# Patient Record
Sex: Female | Born: 1935 | Race: White | Hispanic: Yes | Smoking: Never smoker
Health system: Southern US, Community
[De-identification: ages and names within clinical notes are randomized; demographics above are authoritative.]

## PROBLEM LIST (undated history)

## (undated) DIAGNOSIS — R9431 Abnormal electrocardiogram [ECG] [EKG]: Secondary | ICD-10-CM

## (undated) DIAGNOSIS — F32A Depression, unspecified: Secondary | ICD-10-CM

## (undated) DIAGNOSIS — I4891 Unspecified atrial fibrillation: Secondary | ICD-10-CM

## (undated) DIAGNOSIS — I351 Nonrheumatic aortic (valve) insufficiency: Secondary | ICD-10-CM

## (undated) DIAGNOSIS — F329 Major depressive disorder, single episode, unspecified: Secondary | ICD-10-CM

## (undated) DIAGNOSIS — M1990 Unspecified osteoarthritis, unspecified site: Secondary | ICD-10-CM

## (undated) DIAGNOSIS — I340 Nonrheumatic mitral (valve) insufficiency: Secondary | ICD-10-CM

## (undated) DIAGNOSIS — F419 Anxiety disorder, unspecified: Secondary | ICD-10-CM

## (undated) DIAGNOSIS — I10 Essential (primary) hypertension: Secondary | ICD-10-CM

## (undated) DIAGNOSIS — G20 Parkinson's disease: Secondary | ICD-10-CM

## (undated) DIAGNOSIS — G20A1 Parkinson's disease without dyskinesia, without mention of fluctuations: Secondary | ICD-10-CM

## (undated) DIAGNOSIS — S32000A Wedge compression fracture of unspecified lumbar vertebra, initial encounter for closed fracture: Secondary | ICD-10-CM

## (undated) HISTORY — DX: Nonrheumatic aortic (valve) insufficiency: I35.1

## (undated) HISTORY — DX: Nonrheumatic mitral (valve) insufficiency: I34.0

## (undated) HISTORY — DX: Hypomagnesemia: E83.42

## (undated) HISTORY — DX: Abnormal electrocardiogram (ECG) (EKG): R94.31

## (undated) HISTORY — DX: Essential (primary) hypertension: I10

## (undated) HISTORY — DX: Wedge compression fracture of unspecified lumbar vertebra, initial encounter for closed fracture: S32.000A

---

## 2013-10-07 HISTORY — PX: PACEMAKER INSERTION: SHX728

## 2018-01-12 ENCOUNTER — Other Ambulatory Visit (HOSPITAL_COMMUNITY): Payer: Self-pay | Admitting: Family Medicine

## 2018-01-12 DIAGNOSIS — M549 Dorsalgia, unspecified: Secondary | ICD-10-CM

## 2018-01-18 ENCOUNTER — Other Ambulatory Visit: Payer: Self-pay | Admitting: Family Medicine

## 2018-01-18 DIAGNOSIS — R141 Gas pain: Secondary | ICD-10-CM

## 2018-01-30 ENCOUNTER — Other Ambulatory Visit: Payer: Self-pay

## 2018-01-31 ENCOUNTER — Other Ambulatory Visit: Payer: Self-pay

## 2018-01-31 ENCOUNTER — Emergency Department (HOSPITAL_COMMUNITY): Payer: Medicare HMO

## 2018-01-31 ENCOUNTER — Inpatient Hospital Stay (HOSPITAL_COMMUNITY)
Admission: EM | Admit: 2018-01-31 | Discharge: 2018-02-07 | DRG: 543 | Disposition: A | Discharge status: Skilled Nursing Facility | Payer: Medicare HMO | Attending: Family Medicine | Admitting: Family Medicine

## 2018-01-31 ENCOUNTER — Encounter (HOSPITAL_COMMUNITY): Payer: Self-pay | Admitting: Emergency Medicine

## 2018-01-31 DIAGNOSIS — S32040A Wedge compression fracture of fourth lumbar vertebra, initial encounter for closed fracture: Secondary | ICD-10-CM

## 2018-01-31 DIAGNOSIS — Z950 Presence of cardiac pacemaker: Secondary | ICD-10-CM

## 2018-01-31 DIAGNOSIS — E876 Hypokalemia: Secondary | ICD-10-CM | POA: Diagnosis not present

## 2018-01-31 DIAGNOSIS — R52 Pain, unspecified: Secondary | ICD-10-CM

## 2018-01-31 DIAGNOSIS — S2243XA Multiple fractures of ribs, bilateral, initial encounter for closed fracture: Secondary | ICD-10-CM | POA: Diagnosis present

## 2018-01-31 DIAGNOSIS — Z7401 Bed confinement status: Secondary | ICD-10-CM | POA: Diagnosis not present

## 2018-01-31 DIAGNOSIS — M4854XA Collapsed vertebra, not elsewhere classified, thoracic region, initial encounter for fracture: Secondary | ICD-10-CM | POA: Diagnosis present

## 2018-01-31 DIAGNOSIS — S32030A Wedge compression fracture of third lumbar vertebra, initial encounter for closed fracture: Secondary | ICD-10-CM

## 2018-01-31 DIAGNOSIS — S32010A Wedge compression fracture of first lumbar vertebra, initial encounter for closed fracture: Secondary | ICD-10-CM

## 2018-01-31 DIAGNOSIS — S32000D Wedge compression fracture of unspecified lumbar vertebra, subsequent encounter for fracture with routine healing: Secondary | ICD-10-CM | POA: Diagnosis not present

## 2018-01-31 DIAGNOSIS — F419 Anxiety disorder, unspecified: Secondary | ICD-10-CM | POA: Diagnosis present

## 2018-01-31 DIAGNOSIS — Z9181 History of falling: Secondary | ICD-10-CM

## 2018-01-31 DIAGNOSIS — S22000D Wedge compression fracture of unspecified thoracic vertebra, subsequent encounter for fracture with routine healing: Secondary | ICD-10-CM | POA: Diagnosis not present

## 2018-01-31 DIAGNOSIS — M6281 Muscle weakness (generalized): Secondary | ICD-10-CM | POA: Diagnosis not present

## 2018-01-31 DIAGNOSIS — F329 Major depressive disorder, single episode, unspecified: Secondary | ICD-10-CM | POA: Diagnosis present

## 2018-01-31 DIAGNOSIS — I4891 Unspecified atrial fibrillation: Secondary | ICD-10-CM | POA: Diagnosis not present

## 2018-01-31 DIAGNOSIS — Z888 Allergy status to other drugs, medicaments and biological substances status: Secondary | ICD-10-CM

## 2018-01-31 DIAGNOSIS — R7303 Prediabetes: Secondary | ICD-10-CM | POA: Diagnosis present

## 2018-01-31 DIAGNOSIS — F32A Depression, unspecified: Secondary | ICD-10-CM

## 2018-01-31 DIAGNOSIS — S32000A Wedge compression fracture of unspecified lumbar vertebra, initial encounter for closed fracture: Secondary | ICD-10-CM

## 2018-01-31 DIAGNOSIS — K219 Gastro-esophageal reflux disease without esophagitis: Secondary | ICD-10-CM | POA: Diagnosis not present

## 2018-01-31 DIAGNOSIS — H409 Unspecified glaucoma: Secondary | ICD-10-CM | POA: Diagnosis not present

## 2018-01-31 DIAGNOSIS — M549 Dorsalgia, unspecified: Secondary | ICD-10-CM | POA: Diagnosis not present

## 2018-01-31 DIAGNOSIS — S32020A Wedge compression fracture of second lumbar vertebra, initial encounter for closed fracture: Secondary | ICD-10-CM

## 2018-01-31 DIAGNOSIS — R41841 Cognitive communication deficit: Secondary | ICD-10-CM | POA: Diagnosis not present

## 2018-01-31 DIAGNOSIS — S22000A Wedge compression fracture of unspecified thoracic vertebra, initial encounter for closed fracture: Secondary | ICD-10-CM

## 2018-01-31 DIAGNOSIS — I10 Essential (primary) hypertension: Secondary | ICD-10-CM | POA: Diagnosis present

## 2018-01-31 DIAGNOSIS — K5900 Constipation, unspecified: Secondary | ICD-10-CM | POA: Diagnosis not present

## 2018-01-31 DIAGNOSIS — Z7901 Long term (current) use of anticoagulants: Secondary | ICD-10-CM | POA: Diagnosis not present

## 2018-01-31 DIAGNOSIS — R262 Difficulty in walking, not elsewhere classified: Secondary | ICD-10-CM | POA: Diagnosis not present

## 2018-01-31 DIAGNOSIS — Z23 Encounter for immunization: Secondary | ICD-10-CM | POA: Diagnosis not present

## 2018-01-31 DIAGNOSIS — Z79899 Other long term (current) drug therapy: Secondary | ICD-10-CM

## 2018-01-31 DIAGNOSIS — M4856XA Collapsed vertebra, not elsewhere classified, lumbar region, initial encounter for fracture: Secondary | ICD-10-CM | POA: Diagnosis not present

## 2018-01-31 DIAGNOSIS — D649 Anemia, unspecified: Secondary | ICD-10-CM | POA: Diagnosis not present

## 2018-01-31 DIAGNOSIS — G20 Parkinson's disease: Secondary | ICD-10-CM | POA: Diagnosis not present

## 2018-01-31 DIAGNOSIS — M2550 Pain in unspecified joint: Secondary | ICD-10-CM | POA: Diagnosis not present

## 2018-01-31 HISTORY — DX: Major depressive disorder, single episode, unspecified: F32.9

## 2018-01-31 HISTORY — DX: Parkinson's disease without dyskinesia, without mention of fluctuations: G20.A1

## 2018-01-31 HISTORY — DX: Essential (primary) hypertension: I10

## 2018-01-31 HISTORY — DX: Depression, unspecified: F32.A

## 2018-01-31 HISTORY — DX: Wedge compression fracture of unspecified lumbar vertebra, initial encounter for closed fracture: S32.000A

## 2018-01-31 HISTORY — DX: Anxiety disorder, unspecified: F41.9

## 2018-01-31 HISTORY — DX: Parkinson's disease: G20

## 2018-01-31 HISTORY — DX: Unspecified atrial fibrillation: I48.91

## 2018-01-31 HISTORY — DX: Unspecified osteoarthritis, unspecified site: M19.90

## 2018-01-31 LAB — BASIC METABOLIC PANEL
ANION GAP: 11 (ref 5–15)
BUN: 17 mg/dL (ref 8–23)
CALCIUM: 9.7 mg/dL (ref 8.9–10.3)
CO2: 28 mmol/L (ref 22–32)
Chloride: 103 mmol/L (ref 98–111)
Creatinine, Ser: 0.8 mg/dL (ref 0.44–1.00)
GFR calc Af Amer: >60 mL/min (ref >60)
GLUCOSE: 93 mg/dL (ref 70–99)
Potassium: 3.8 mmol/L (ref 3.5–5.1)
SODIUM: 142 mmol/L (ref 135–145)

## 2018-01-31 LAB — HEPATIC FUNCTION PANEL
ALBUMIN: 3.3 g/dL — AB (ref 3.5–5.0)
ALT: 16 U/L (ref 0–44)
AST: 27 U/L (ref 15–41)
Alkaline Phosphatase: 159 U/L — ABNORMAL HIGH (ref 38–126)
BILIRUBIN TOTAL: 0.9 mg/dL (ref 0.3–1.2)
Bilirubin, Direct: 0.2 mg/dL (ref 0.0–0.2)
Indirect Bilirubin: 0.7 mg/dL (ref 0.3–0.9)
Total Protein: 6.8 g/dL (ref 6.5–8.1)

## 2018-01-31 LAB — URINALYSIS, ROUTINE W REFLEX MICROSCOPIC
BACTERIA UA: NONE SEEN
Bilirubin Urine: NEGATIVE
Glucose, UA: NEGATIVE mg/dL
KETONES UR: NEGATIVE mg/dL
Leukocytes, UA: NEGATIVE
Nitrite: NEGATIVE
PH: 8 (ref 5.0–8.0)
Protein, ur: NEGATIVE mg/dL
SPECIFIC GRAVITY, URINE: 1.006 (ref 1.005–1.030)

## 2018-01-31 LAB — CBC WITH DIFFERENTIAL/PLATELET
BASOS ABS: 0 10*3/uL (ref 0.0–0.1)
Basophils Relative: 0 %
EOS PCT: 0 %
Eosinophils Absolute: 0 10*3/uL (ref 0.0–0.7)
HCT: 38.8 % (ref 36.0–46.0)
Hemoglobin: 12.4 g/dL (ref 12.0–15.0)
Lymphocytes Relative: 6 %
Lymphs Abs: 0.6 10*3/uL — ABNORMAL LOW (ref 0.7–4.0)
MCH: 31.8 pg (ref 26.0–34.0)
MCHC: 32 g/dL (ref 30.0–36.0)
MCV: 99.5 fL (ref 78.0–100.0)
MONO ABS: 0.6 10*3/uL (ref 0.1–1.0)
Monocytes Relative: 7 %
Neutro Abs: 8 10*3/uL — ABNORMAL HIGH (ref 1.7–7.7)
Neutrophils Relative %: 87 %
PLATELETS: 322 10*3/uL (ref 150–400)
RBC: 3.9 MIL/uL (ref 3.87–5.11)
RDW: 14 % (ref 11.5–15.5)
WBC: 9.3 10*3/uL (ref 4.0–10.5)

## 2018-01-31 LAB — PROTIME-INR
INR: 1.19
Prothrombin Time: 15 seconds (ref 11.4–15.2)

## 2018-01-31 LAB — MAGNESIUM: Magnesium: 1.7 mg/dL (ref 1.7–2.4)

## 2018-01-31 LAB — PHOSPHORUS: PHOSPHORUS: 3.3 mg/dL (ref 2.5–4.6)

## 2018-01-31 LAB — APTT: APTT: 34 s (ref 24–36)

## 2018-01-31 MED ORDER — MORPHINE SULFATE (PF) 4 MG/ML IV SOLN
4.0000 mg | Freq: Once | INTRAVENOUS | Status: AC
  Administered 2018-01-31: 4 mg via INTRAVENOUS
  Filled 2018-01-31: qty 1

## 2018-01-31 MED ORDER — ACETAMINOPHEN 500 MG PO TABS
500.0000 mg | ORAL_TABLET | Freq: Four times a day (QID) | ORAL | Status: DC
  Administered 2018-01-31 – 2018-02-07 (×25): 500 mg via ORAL
  Filled 2018-01-31 (×24): qty 1

## 2018-01-31 MED ORDER — PANTOPRAZOLE SODIUM 40 MG PO TBEC
40.0000 mg | DELAYED_RELEASE_TABLET | Freq: Every day | ORAL | Status: DC
  Administered 2018-02-01 – 2018-02-07 (×7): 40 mg via ORAL
  Filled 2018-01-31 (×7): qty 1

## 2018-01-31 MED ORDER — ONDANSETRON HCL 4 MG/2ML IJ SOLN: 4.0000 mg | Freq: Four times a day (QID) | INTRAMUSCULAR | Status: DC | PRN

## 2018-01-31 MED ORDER — CITALOPRAM HYDROBROMIDE 20 MG PO TABS
20.0000 mg | ORAL_TABLET | Freq: Every day | ORAL | Status: DC
  Administered 2018-01-31 – 2018-02-06 (×7): 20 mg via ORAL
  Filled 2018-01-31 (×7): qty 1

## 2018-01-31 MED ORDER — ENOXAPARIN SODIUM 60 MG/0.6ML ~~LOC~~ SOLN
1.0000 mg/kg | Freq: Two times a day (BID) | SUBCUTANEOUS | Status: DC
  Administered 2018-01-31 – 2018-02-04 (×8): 50 mg via SUBCUTANEOUS
  Filled 2018-01-31 (×8): qty 0.5

## 2018-01-31 MED ORDER — DOCUSATE SODIUM 100 MG PO CAPS
100.0000 mg | ORAL_CAPSULE | Freq: Two times a day (BID) | ORAL | Status: DC
  Administered 2018-01-31 – 2018-02-04 (×8): 100 mg via ORAL
  Filled 2018-01-31 (×8): qty 1

## 2018-01-31 MED ORDER — OXYCODONE HCL 5 MG PO TABS
5.0000 mg | ORAL_TABLET | ORAL | Status: DC | PRN
  Administered 2018-01-31 – 2018-02-01 (×2): 5 mg via ORAL
  Filled 2018-01-31 (×2): qty 1

## 2018-01-31 MED ORDER — PREGABALIN 75 MG PO CAPS
75.0000 mg | ORAL_CAPSULE | Freq: Two times a day (BID) | ORAL | Status: DC
  Administered 2018-01-31 – 2018-02-07 (×14): 75 mg via ORAL
  Filled 2018-01-31 (×14): qty 1

## 2018-01-31 MED ORDER — HYDROMORPHONE HCL 1 MG/ML IJ SOLN
0.5000 mg | INTRAMUSCULAR | Status: DC | PRN
  Administered 2018-01-31 – 2018-02-05 (×16): 0.5 mg via INTRAVENOUS
  Filled 2018-01-31 (×14): qty 0.5
  Filled 2018-01-31: qty 1
  Filled 2018-01-31: qty 0.5

## 2018-01-31 MED ORDER — METOPROLOL SUCCINATE ER 25 MG PO TB24
25.0000 mg | ORAL_TABLET | Freq: Every day | ORAL | Status: DC
  Administered 2018-02-01 – 2018-02-07 (×7): 25 mg via ORAL
  Filled 2018-01-31 (×7): qty 1

## 2018-01-31 MED ORDER — POLYETHYLENE GLYCOL 3350 17 G PO PACK
17.0000 g | PACK | Freq: Every day | ORAL | Status: DC | PRN
  Administered 2018-02-03: 17 g via ORAL

## 2018-01-31 MED ORDER — SENNA 8.6 MG PO TABS
1.0000 | ORAL_TABLET | Freq: Two times a day (BID) | ORAL | Status: DC
  Administered 2018-01-31 – 2018-02-04 (×8): 8.6 mg via ORAL
  Filled 2018-01-31 (×8): qty 1

## 2018-01-31 MED ORDER — PRAMIPEXOLE DIHYDROCHLORIDE 0.25 MG PO TABS
0.2500 mg | ORAL_TABLET | Freq: Three times a day (TID) | ORAL | Status: DC
  Administered 2018-01-31 – 2018-02-07 (×20): 0.25 mg via ORAL
  Filled 2018-01-31 (×20): qty 1

## 2018-01-31 MED ORDER — CALCIUM CARBONATE-VITAMIN D 500-200 MG-UNIT PO TABS
1.0000 | ORAL_TABLET | Freq: Every day | ORAL | Status: DC
  Administered 2018-02-02 – 2018-02-07 (×6): 1 via ORAL
  Filled 2018-01-31 (×6): qty 1

## 2018-01-31 MED ORDER — ONDANSETRON HCL 4 MG PO TABS: 4.0000 mg | ORAL_TABLET | Freq: Four times a day (QID) | ORAL | Status: DC | PRN

## 2018-01-31 MED ORDER — FENTANYL CITRATE (PF) 100 MCG/2ML IJ SOLN
12.5000 ug | Freq: Once | INTRAMUSCULAR | Status: AC
  Administered 2018-01-31: 12.5 ug via INTRAVENOUS
  Filled 2018-01-31: qty 2

## 2018-01-31 NOTE — ED Notes (Signed)
[employer] called for TLSO brace.

## 2018-01-31 NOTE — ED Provider Notes (Addendum)
Patient accepted at shift change from Dr. Jeraldine LootsLockwood for planned admission  Per nursing staff request for additional pain medication have provided 4 mg morphine.  Clinical Course as of Jan 31 1810  Wed Jan 31, 2018  1802 Page text sent to Dr. Alanda SlimGonfa to update on x-ray result and Dr. Priscille LovelessLockwood's conversation with referring orthopod for planned orthopedic consultation in a.m.   [MP]  1806 Dr. Alanda SlimGonfa called back to state that because the x-ray was not completed here, although patient was referred by Ortho with known fractures, he wanted the work-up complete before he could be consulted for admission.  Dr. Jeraldine LootsLockwood has consulted orthopedics who referred the patient to the emergency department and who plans to do consultation in the morning.  Dr. Alanda SlimGonfa advises at this time the patient should go back into the admission pool and a reconsult to the hospitalist group.  As per Dr. Sundra AlandGonfa's request, consultation has been placed back in to the hospitalist que.   [MP]    Clinical Course User Index [MP] Arby BarrettePfeiffer, Braydn Carneiro, MD     Arby BarrettePfeiffer, Susanna Benge, MD 01/31/18 1810    Arby BarrettePfeiffer, Christasia Angeletti, MD 01/31/18 [id number]

## 2018-01-31 NOTE — Discharge Planning (Signed)
Clinical Social Work is seeking post-discharge placement for this patient at the following level of care: SNF.    

## 2018-01-31 NOTE — ED Provider Notes (Addendum)
[facility]-EMERGENCY DEPT Provider Note   CSN: [id number] Arrival date & time: 01/31/18  4098     History   Chief Complaint Chief Complaint  Patient presents with  . Back Pain  . Abdominal Pain    HPI Teresa Bates is a 84 y.o. female.  HPI Vision presents with her son who assists with the HPI. She presents to persistent/worsening low back pain purulent pain is severe in the back, sore, interfering with her ability to ambulate Pain radiates and to the abdomen, but she complains essentially of low back pain, states that this is her primary concern. Patient has previously diagnosed compression fractures in the lumbar spine. She has had minimal relief using Norco at home. No new incontinence, no new fall She typically uses a walker, but even with this assistant she is unable to ambulate at home. No new incontinence, no new fever, no new loss of sensation in her legs The pain does occasionally radiate down her legs.    Past Medical History:  Diagnosis Date  . A-fib (HCC)   . Anxiety   . Arthritis   . Depression   . Hypertension   . Parkinson's disease (HCC)     There are no active problems to display for this patient.   Past Surgical History:  Procedure Laterality Date  . PACEMAKER INSERTION  10/2013     OB History   None      Home Medications    Prior to Admission medications   Medication Sig Start Date End Date Taking? Authorizing Provider  acetaminophen (TYLENOL) 500 MG tablet Take 1,000 mg by mouth 3 (three) times daily.   Yes [provider]  apixaban (ELIQUIS) 2.5 MG TABS tablet Take 2.5 mg by mouth 2 (two) times daily.   Yes [provider]  citalopram (CELEXA) 20 MG tablet Take 20 mg by mouth at bedtime.   Yes [provider]  docusate sodium (COLACE) 100 MG capsule Take 100 mg by mouth at bedtime.   Yes [provider]  HYDROcodone-acetaminophen (NORCO/VICODIN) 5-325 MG tablet Take 1 tablet  by mouth every 6 (six) hours as needed for moderate pain.   Yes [provider]  lisinopril (PRINIVIL,ZESTRIL) 2.5 MG tablet Take 2.5 mg by mouth at bedtime.   Yes [provider]  magnesium hydroxide (MILK OF MAGNESIA) 400 MG/5ML suspension Take 30 mLs by mouth daily.   Yes [provider]  metoprolol succinate (TOPROL-XL) 25 MG 24 hr tablet Take 12.5 mg by mouth 2 (two) times daily.   Yes [provider]  omeprazole (PRILOSEC) 20 MG capsule Take 20 mg by mouth daily.   Yes [provider]  pramipexole (MIRAPEX) 0.25 MG tablet Take 0.25 mg by mouth 3 (three) times daily.   Yes [provider]  pregabalin (LYRICA) 75 MG capsule Take 75 mg by mouth 2 (two) times daily.   Yes [provider]  traMADol (ULTRAM) 50 MG tablet Take 50 mg by mouth 3 (three) times daily as needed for moderate pain.   Yes [provider]    Family History History reviewed. No pertinent family history.  Social History Social History   Tobacco Use  . Smoking status: Never Smoker  . Smokeless tobacco: Never Used  Substance Use Topics  . Alcohol use: Never    Frequency: Never  . Drug use: Never     Allergies   Carbamazepine   Review of Systems Review of Systems  Constitutional:  Per HPI, otherwise negative  HENT:       Per HPI, otherwise negative  Respiratory:       Per HPI, otherwise negative  Cardiovascular:       Per HPI, otherwise negative  Gastrointestinal: Negative for vomiting.  Endocrine:       Negative aside from HPI  Genitourinary:       Neg aside from HPI   Musculoskeletal:       Per HPI, otherwise negative  Skin: Negative.   Neurological: Positive for weakness. Negative for syncope.     Physical Exam Updated Vital Signs BP (!) 163/90 (BP Location: Left Arm)   Pulse 87   Temp 97.7 F (36.5 C) (Oral)   Resp 16   Ht 5' (1.524 m)   Wt 49.9 kg   SpO2 95%   BMI 21.48 kg/m   Physical Exam    Constitutional: She is oriented to person, place, and time. She has a sickly appearance. No distress.  HENT:  Head: Normocephalic and atraumatic.  Eyes: Conjunctivae and EOM are normal.  Cardiovascular: Normal rate and regular rhythm.  Pulmonary/Chest: Effort normal and breath sounds normal. No stridor. No respiratory distress.  Abdominal: She exhibits no distension.    Musculoskeletal: She exhibits no edema.  Patient flexes each hip independently, though with pain referred to the lower back with motion bilaterally. There is atrophy, but strength is appropriate for age in both lower extremities.   Neurological: She is alert and oriented to person, place, and time. She displays atrophy. No cranial nerve deficit.  Skin: Skin is warm and dry.  Psychiatric: She has a normal mood and affect.  Nursing note and vitals reviewed.    ED Treatments / Results  Labs (all labs ordered are listed, but only abnormal results are displayed) Labs Reviewed  CBC WITH DIFFERENTIAL/PLATELET - Abnormal; Notable for the following components:      Result Value   Neutro Abs 8.0 (*)    Lymphs Abs 0.6 (*)    All other components within normal limits  BASIC METABOLIC PANEL  URINALYSIS, ROUTINE W REFLEX MICROSCOPIC    Procedures Procedures (including critical care time)  Medications Ordered in ED Medications  fentaNYL (SUBLIMAZE) injection 12.5 mcg (has no administration in time range)     Initial Impression / Assessment and Plan / ED Course  I have reviewed the triage vital signs and the nursing notes.  Pertinent labs & imaging results that were available during my care of the patient were reviewed by me and considered in my medical decision making (see chart for details).  Clinical Course as of Feb 01 2325  Wed Jan 31, 2018  1802 Page text sent to Dr. Alanda Slim to update on x-ray result and Dr. Priscille Loveless conversation with referring orthopod for planned orthopedic consultation in a.m.   [MP]   1806 Dr. Alanda Slim called back to state that because the x-ray was not completed here, although patient was referred by Ortho with known fractures, he wanted the work-up complete before he could be consulted for admission.  Dr. Jeraldine Loots has consulted orthopedics who referred the patient to the emergency department and who plans to do consultation in the morning.  Dr. Alanda Slim advises at this time the patient should go back into the admission pool and a reconsult to the hospitalist group.  As per Dr. Sundra Aland request, consultation has been placed back in to the hospitalist que.   [MP]  1908 Patient reports she is adequately comfortable if she is not  moving at all.  Patient son reports that she was in agony and the morphine seems to be helping.   [MP]  1939 Repeat order placed for consult hospitalist   [MP]  1957 Consult: Dr. Elesa Hacker has returned call for hospitalist and will admit for pain control.   [MP]    Clinical Course User Index [MP] Arby Barrette, MD   Chart review notable for ongoing work with 1 of our orthopedists due to her compression fracture.  Notes does treat the patient has compression fracture of L2, L4. Patient is also apparently scheduled for ultrasound, but today has no appreciable abdominal pain, notes that this is not currently an issue, and emergent imaging of her abdomen not currently indicated.  Date:, Patient continues to complain of pain. However, she continues to move all extremities spontaneously, remains hemodynamically unremarkable. After the initial evaluation I discussed her case with our case management colleagues, social work Acupuncturist to consider options for home health, and/or rehabilitation placement. As I discussed patient's case with her orthopedic staff, the patient was fitted with a TLSO brace.  Update:, Patient unwilling to wear the TLSO brace, due to discomfort. Patient has been seen and evaluated by social work, case management, who notes that the patient  may require some time for placement due to [place]. We discussed home health options, which have been discussed with patient and her son.  3:42 PM Remains in her condition, hemodynamically unremarkable, awake, alert, and I discussed options for treatment with the patient's son again. Labs unremarkable, belly remains soft, she is afebrile, and there is no indication for advanced imaging of the belly today. Patient has known lumbar fractures, but has no evidence for nervous system complications, has received TLSO brace, which she has not tolerated well, and we discussed options for pain management at length.   5:01 PM She has received a new TLSO brace, which is better tolerated, but she continues to complain of pain, poorly controlled in spite of narcotics.  Attempts at retrieving imaging of her compression fractures have been unsuccessful, patient is having additional imaging performed currently.  Discussed patient's case with her orthopedic team, they will evaluate the patient is a consulting service tomorrow morning.  This elderly female presents with ongoing back pain, poorly controlled narcotics due to multiple compression fractures. Patient's orthopedic team is aware of admission for physical therapy, pain control. Patient admitted to the hospitalist service for further evaluation and management.  Final Clinical Impressions(s) / ED Diagnoses  Lumbar compression fracture, multiple   Gerhard Munch, MD 01/31/18 1725    Gerhard Munch, MD 02/01/18 2326

## 2018-01-31 NOTE — Progress Notes (Addendum)
CSW spoke with patient and patient's family at bedside. CSW aware patient is experiencing back pain due to lumbar fractures. Per son, patient lives with him but he would like patient to go to a rehabilitation facility. CSW discussed barriers to placement such as patient having [employer] Medicare and the authorization process. CSW inquired about interest in getting patient set up with Home Health Services that would include Social Work to help facilitate placement from the community. CSW has provided family with list of SNFs in this area and surrounding areas. CSW has consulted with RNCM regarding HH services.   2:30pm- CSW informed patient is here visiting family from [place]. Per patient's family, patient has a flight scheduled for November 4 to return home. CSW explained to patient and patient's family that in order to get placement, patient would require a PASSAR number. CSW explained that since patient is an out of state resident, acquiring a PASSAR number may take a while. CSW informed family that patient has been set up with Home Health Services through [facility] and that they should be in contact within 24-48 hours. No further CSW needs at this time, please reconsult if needs arise.  Archie Balboa, LCSWA  Clinical Social Work Department  [employer]  [phone]

## 2018-01-31 NOTE — ED Triage Notes (Signed)
Patient BIB her son. C/o of back pain x2 months and abdominal pain x 71month. Pt has seen by Dr. Dinah Beersumonsaki for bp, dx pt with multiple compression fx at L2 and L4.. Pt supposed to have a bone scan but hasn't been able to have yet. Pts pain has increased in the last 2 days un-controled with pain med. Pt also c/o abdominal pain and bloating and constipation. Pt was sched. For US of abdomen but had to cancel due to pain.

## 2018-01-31 NOTE — H&P (Signed)
History and Physical    Teresa Bates ZOX:[id number] DOB: 1929-10-27 DOA: 01/31/2018  PCP: Thurman Coyer, MD Patient coming from: Son's home  I have personally briefly reviewed patient's old medical records in [facility] Link  Chief Complaint: Back pain  HPI: Teresa Bates is a 84 y.o. Spanish-speaking female with medical history significant for A. fib on Eliquis, PPM placement in 2015 [facility]. Jude's [id number], serial #[id number]), Parkinson's disease, hypertension, depression/anxiety and chronic back pain who presents to the ED with 2 months of progressively worsening lower back pain that is stabbing in nature with radiation to the bilateral thighs and is limiting activities due to pain. History mostly obtained from patient's son, Teresa Bates. No recent falls or trauma.  No focal weakness, numbness, tingling in either lower extremity.  No loss of bowel or bladder continence, no urinary retention.  No visual disturbance or change in speech.  There is no known history of malignancy.  Patient is currently visiting her son from [place], [place], and he was concerned about her worsening pain that was uncontrolled with home Norco.  ED Course: In the ED, patient was in severe pain requiring multiple doses of IV narcotics.  He was afebrile, mildly tachycardic, hypertensive and saturating comfortably on room air.  Labs showed no abnormality.  Urinalysis was unremarkable.  X-ray of the T and L-spine showed severe T9 fracture and L1-L5 compression fractures of unknown acuity.  Orthopedic surgery was consulted and recommended admission for pain control and likely nonoperative management of her lumbar compression fractures.  Review of Systems: As per HPI otherwise 10 point review of systems negative.   Past Medical History:  Diagnosis Date  . A-fib (HCC)   . Anxiety   . Arthritis   . Depression   . Hypertension   . Parkinson's disease [facility])     Past Surgical History:  Procedure Laterality Date  .  PACEMAKER INSERTION  10/2013     reports that she has never smoked. She has never used smokeless tobacco. She reports that she does not drink alcohol or use drugs.  Allergies  Allergen Reactions  . Carbamazepine Rash    History reviewed. No pertinent family history.   Prior to Admission medications   Medication Sig Start Date End Date Taking? Authorizing Provider  acetaminophen (TYLENOL) 500 MG tablet Take 1,000 mg by mouth 3 (three) times daily.   Yes [provider]  apixaban (ELIQUIS) 2.5 MG TABS tablet Take 2.5 mg by mouth 2 (two) times daily.   Yes [provider]  citalopram (CELEXA) 20 MG tablet Take 20 mg by mouth at bedtime.   Yes [provider]  docusate sodium (COLACE) 100 MG capsule Take 100 mg by mouth at bedtime.   Yes [provider]  HYDROcodone-acetaminophen (NORCO/VICODIN) 5-325 MG tablet Take 1 tablet by mouth every 6 (six) hours as needed for moderate pain.   Yes [provider]  lisinopril (PRINIVIL,ZESTRIL) 2.5 MG tablet Take 2.5 mg by mouth at bedtime.   Yes [provider]  magnesium hydroxide (MILK OF MAGNESIA) 400 MG/5ML suspension Take 30 mLs by mouth daily.   Yes [provider]  metoprolol succinate (TOPROL-XL) 25 MG 24 hr tablet Take 12.5 mg by mouth 2 (two) times daily.   Yes [provider]  omeprazole (PRILOSEC) 20 MG capsule Take 20 mg by mouth daily.   Yes [provider]  pramipexole (MIRAPEX) 0.25 MG tablet Take 0.25 mg by mouth 3 (three) times daily.   Yes [provider]  pregabalin (LYRICA) 75 MG capsule Take 75 mg by mouth 2 (two) times daily.   Yes [provider]  traMADol (ULTRAM) 50 MG tablet Take 50 mg by mouth 3 (three) times daily as needed for moderate pain.   Yes [provider]    Physical Exam: Vitals:   01/31/18 2024 01/31/18 2045 01/31/18 2125 01/31/18 2204  BP: (!) 190/130 (!) 177/150 (!) 177/102 (!) 170/96  Pulse: 96 97  89 95  Resp: 18   16  Temp:    98.5 F (36.9 C)  TempSrc:    Oral  SpO2: 92% 94% (!) 89% 90%  Weight:      Height:        Constitutional: NAD, calm, comfortable Eyes: PERRL, lids and conjunctivae normal ENMT: Mucous membranes are moist. Posterior pharynx clear of any exudate or lesions. Neck: normal, supple, no masses Respiratory: clear to auscultation bilaterally, no wheezing, no crackles. Normal respiratory effort. Cardiovascular: Regular rate and rhythm, no murmurs / rubs / gallops. No extremity edema. 2+ pedal pulses. Abdomen: no tenderness, no masses palpated. Bowel sounds positive.  Musculoskeletal: no clubbing / cyanosis. No joint deformity upper and lower extremities. Good ROM, no contractures. Normal muscle tone.  Skin: no rashes, lesions, ulcers. No induration Neurologic: CN 2-12 grossly intact. Sensation intact in BLE, DTR normal. Strength 5/5 in all 4 with some limitation due to pain with hip flexion and extension. Psychiatric: Normal judgment and insight. Alert and oriented x 3. Normal mood.   Labs on Admission: I have personally reviewed following labs and imaging studies  CBC: Recent Labs  Lab 01/31/18 1148  WBC 9.3  NEUTROABS 8.0*  HGB 12.4  HCT 38.8  MCV 99.5  PLT 322   Basic Metabolic Panel: Recent Labs  Lab 01/31/18 1148  NA 142  K 3.8  CL 103  CO2 28  GLUCOSE 93  BUN 17  CREATININE 0.80  CALCIUM 9.7   GFR: Estimated Creatinine Clearance: 36.9 mL/min (by C-G formula based on SCr of 0.8 mg/dL). Liver Function Tests: No results for input(s): AST, ALT, ALKPHOS, BILITOT, PROT, ALBUMIN in the last 168 hours. No results for input(s): LIPASE, AMYLASE in the last 168 hours. No results for input(s): AMMONIA in the last 168 hours. Coagulation Profile: No results for input(s): INR, PROTIME in the last 168 hours. Cardiac Enzymes: No results for input(s): CKTOTAL, CKMB, CKMBINDEX, TROPONINI in the last 168 hours. BNP (last 3 results) No results for  input(s): PROBNP in the last 8760 hours. HbA1C: No results for input(s): HGBA1C in the last 72 hours. CBG: No results for input(s): GLUCAP in the last 168 hours. Lipid Profile: No results for input(s): CHOL, HDL, LDLCALC, TRIG, CHOLHDL, LDLDIRECT in the last 72 hours. Thyroid Function Tests: No results for input(s): TSH, T4TOTAL, FREET4, T3FREE, THYROIDAB in the last 72 hours. Anemia Panel: No results for input(s): VITAMINB12, FOLATE, FERRITIN, TIBC, IRON, RETICCTPCT in the last 72 hours. Urine analysis:    Component Value Date/Time   COLORURINE STRAW (A) 01/31/2018 1520   APPEARANCEUR CLEAR 01/31/2018 1520   LABSPEC 1.006 01/31/2018 1520   PHURINE 8.0 01/31/2018 1520   GLUCOSEU NEGATIVE 01/31/2018 1520   HGBUR MODERATE (A) 01/31/2018 1520   BILIRUBINUR NEGATIVE 01/31/2018 1520   KETONESUR NEGATIVE 01/31/2018 1520   PROTEINUR NEGATIVE 01/31/2018 1520   NITRITE NEGATIVE 01/31/2018 1520   LEUKOCYTESUR NEGATIVE 01/31/2018 1520    Radiological Exams on Admission: Dg Lumbar Spine 2-3 Views  Result Date: 01/31/2018 CLINICAL DATA:  Back  pain for 2 months, abdominal pain for 1 month. History of compression fractures. EXAM: LUMBAR SPINE - 2-3 VIEW COMPARISON:  None. FINDINGS: Limited assessment due to severe osteopenia. Moderate L1, moderate L2, mild L3, moderate L4 and mild L5 compression fractures. Additional thoracic compression fractures, severe at T9. No malalignment. Maintenance of lumbar lordosis. Cardiac pacemaker wires in place. Included prevertebral and paraspinal soft tissue planes are non suspicious. IMPRESSION: 1. Limited assessment due to severe osteopenia. 2. Age indeterminate compression fractures all lumbar levels, moderate at L1 and L4. Severe T9 compression fracture. Electronically Signed   By: Awilda Metro M.D.   On: 01/31/2018 17:39    Assessment/Plan Active Problems:   Lumbar compression fracture (HCC)  Teresa Bates is a 84 y.o. female with above medical  history admitted for progressive low back pain, found to have age-indeterminate thoracic and lumbar compression fractures, nontraumatic in nature.  No evidence for cord compression is noted on history or exam.  Pain is significant at this point and require multiple doses of IV narcotics with incomplete relief.  Although patient is likely nonoperative candidate, patient requires further imaging and orthopedic surgery evaluation prior to discharge.  Thoracic and lumbar compression fractures without cauda equina, initial encounter -Orthopedic surgery consulted, awaiting formal evaluation -Would consider obtaining MRI of the CTL-spine to evaluate for cord involvement (patient has [facility] pacemaker that will need clearance by a device rep) -Pain control with scheduled Tylenol, home Lyrica, as needed oxycodone, low-dose IV Dilaudid for breakthrough -Physical therapy evaluation following clearance from orthopedic surgery -Start calcium and vitamin D supplementation -Bowel regimen  A. Fib -Continue home Toprol -Switch Eliquis to Lovenox 1 mg/kg initially while awaiting formal decision on operative versus non-operative management  HTN -Toprol as above, continue lisinopril  Depression -Continue home Celexa  DVT prophylaxis: Lovenox\ Code Status: Full Family Communication: Son, Teresa Bates Disposition Plan: Home in 1-2 days Consults called: Orthopedic Surgery Admission status: Inpatient  Carline Dura Laney Pastor MD [facility]  If 7PM-7AM, please contact night-coverage [email or web address] Password [facility]  01/31/2018, 10:46 PM

## 2018-01-31 NOTE — Progress Notes (Signed)
ED TO INPATIENT HANDOFF REPORT  Name/Age/Gender Teresa Bates 84 y.o. female  Code Status Advance Directive Documentation     Most Recent Value  Type of Advance Directive  Healthcare Power of [place]  Pre-existing out of facility DNR order (yellow form or pink MOST form)  -  "MOST" Form in Place?  -      Home/SNF/Other Home  Chief Complaint back pain  Level of Care/Admitting Diagnosis ED Disposition    ED Disposition Condition [name] [[id number]]  Level of Care: Med-Surg [16]  Diagnosis: Lumbar compression fracture [facility]) [[id number]]  Admitting Physician: Bennie Pierini [[id number]]  Attending Physician: Jonnie Finner, [place] [[id number]]  Estimated length of stay: past midnight tomorrow  Certification:: I certify this patient will need inpatient services for at least 2 midnights  PT Class (Do Not Modify): Inpatient [101]  PT Acc Code (Do Not Modify): Private [1]       Medical History Past Medical History:  Diagnosis Date  . A-fib ([place])   . Anxiety   . Arthritis   . Depression   . Hypertension   . Parkinson's disease ([place])     Allergies Allergies  Allergen Reactions  . Carbamazepine Rash    IV Location/Drains/Wounds Patient Lines/Drains/Airways Status   Active Line/Drains/Airways    Name:   Placement date:   Placement time:   Site:   Days:   Peripheral IV 01/31/18 Left Antecubital   01/31/18    1148    Antecubital   less than 1   External Urinary Catheter   01/31/18    0910    -   less than 1          Labs/Imaging Results for orders placed or performed during the hospital encounter of 01/31/18 (from the past 48 hour(s))  Basic metabolic panel     Status: None   Collection Time: 01/31/18 11:48 AM  Result Value Ref Range   Sodium 142 135 - 145 mmol/L   Potassium 3.8 3.5 - 5.1 mmol/L   Chloride 103 98 - 111 mmol/L   CO2 28 22 - 32 mmol/L   Glucose, Bld 93 70 - 99 mg/dL   BUN 17 8 - 23 mg/dL    Creatinine, Ser 0.80 0.44 - 1.00 mg/dL   Calcium 9.7 8.9 - 10.3 mg/dL   GFR calc non Af Amer >60 >60 mL/min   GFR calc Af Amer >60 >60 mL/min    Comment: (NOTE) The eGFR has been calculated using the CKD EPI equation. This calculation has not been validated in all clinical situations. eGFR's persistently <60 mL/min signify possible Chronic Kidney Disease.    Anion gap 11 5 - 15    Comment: Performed at [facility], [place] [address] [place], [place] [address]  CBC with Differential     Status: Abnormal   Collection Time: 01/31/18 11:48 AM  Result Value Ref Range   WBC 9.3 4.0 - 10.5 K/uL   RBC 3.90 3.87 - 5.11 MIL/uL   Hemoglobin 12.4 12.0 - 15.0 g/dL   HCT 38.8 36.0 - 46.0 %   MCV 99.5 78.0 - 100.0 fL   MCH 31.8 26.0 - 34.0 pg   MCHC 32.0 30.0 - 36.0 g/dL   RDW 14.0 11.5 - 15.5 %   Platelets 322 150 - 400 K/uL   Neutrophils Relative % 87 %   Neutro Abs 8.0 (H) 1.7 - 7.7 K/uL   Lymphocytes Relative 6 %  Lymphs Abs 0.6 (L) 0.7 - 4.0 K/uL   Monocytes Relative 7 %   Monocytes Absolute 0.6 0.1 - 1.0 K/uL   Eosinophils Relative 0 %   Eosinophils Absolute 0.0 0.0 - 0.7 K/uL   Basophils Relative 0 %   Basophils Absolute 0.0 0.0 - 0.1 K/uL    Comment: Performed at [facility], [place] [address] [place], [place] [address]  Urinalysis, Routine w reflex microscopic     Status: Abnormal   Collection Time: 01/31/18  3:20 PM  Result Value Ref Range   Color, Urine STRAW (A) YELLOW   APPearance CLEAR CLEAR   Specific Gravity, Urine 1.006 1.005 - 1.030   pH 8.0 5.0 - 8.0   Glucose, UA NEGATIVE NEGATIVE mg/dL   Hgb urine dipstick MODERATE (A) NEGATIVE   Bilirubin Urine NEGATIVE NEGATIVE   Ketones, ur NEGATIVE NEGATIVE mg/dL   Protein, ur NEGATIVE NEGATIVE mg/dL   Nitrite NEGATIVE NEGATIVE   Leukocytes, UA NEGATIVE NEGATIVE   RBC / HPF 0-5 0 - 5 RBC/hpf   WBC, UA 0-5 0 - 5 WBC/hpf   Bacteria, UA NONE SEEN NONE SEEN   Squamous Epithelial / LPF 0-5  0 - 5   Mucus PRESENT     Comment: Performed at [facility], [place] [address], [place], [place] [address]   Dg Lumbar Spine 2-3 Views  Result Date: 01/31/2018 CLINICAL DATA:  Back pain for 2 months, abdominal pain for 1 month. History of compression fractures. EXAM: LUMBAR SPINE - 2-3 VIEW COMPARISON:  None. FINDINGS: Limited assessment due to severe osteopenia. Moderate L1, moderate L2, mild L3, moderate L4 and mild L5 compression fractures. Additional thoracic compression fractures, severe at T9. No malalignment. Maintenance of lumbar lordosis. Cardiac pacemaker wires in place. Included prevertebral and paraspinal soft tissue planes are non suspicious. IMPRESSION: 1. Limited assessment due to severe osteopenia. 2. Age indeterminate compression fractures all lumbar levels, moderate at L1 and L4. Severe T9 compression fracture. Electronically Signed   By: Elon Alas M.D.   On: 01/31/2018 17:39    Pending Labs [employer] (From admission, onward)    Start     Ordered   Signed and Held  Hepatic function panel  Add-on,   R     Signed and Held   Signed and Held  Magnesium  Add-on,   R     Signed and Held   Signed and Held  Phosphorus  Add-on,   R     Signed and Held   Signed and Held  APTT  Once,   R     Signed and Held   Signed and Held  Protime-INR  Once,   R     Signed and Held   Signed and Held  Basic metabolic panel  Tomorrow morning,   R     Signed and Held   Signed and Held  CBC  Tomorrow morning,   R     Signed and Held          Vitals/Pain Today's Vitals   01/31/18 1345 01/31/18 1600 01/31/18 1818 01/31/18 2024  BP: (!) 179/87 (!) 162/85 (!) 190/95 (!) 190/130  Pulse: 87 89 97 96  Resp:  '18 18 18  '$ Temp:      TempSrc:      SpO2: 95% 92% 94% 92%  Weight:      Height:      PainSc:        Isolation Precautions No active isolations  Medications Medications  HYDROmorphone (DILAUDID) injection 0.5 mg (0.5 mg Intravenous Given 01/31/18 2046)   fentaNYL (SUBLIMAZE) injection 12.5 mcg (12.5 mcg Intravenous Given 01/31/18 1144)  morphine 4 MG/ML injection 4 mg (4 mg Intravenous Given 01/31/18 1254)  morphine 4 MG/ML injection 4 mg (4 mg Intravenous Given 01/31/18 1807)    Mobility walks with device

## 2018-01-31 NOTE — Discharge Planning (Signed)
Teresa Bates [name], RN, BSN, NCM [phone] Spoke with pt at bedside regarding discharge planning for Home Health Services. Offered pt list of home health agencies to choose from.  Pt chose [facility] to render services. Ellen Williams of [facility] notified. Patient made aware that [facility] will be in contact in 24-48 hours.  No DME needs identified at this time.  

## 2018-02-01 ENCOUNTER — Other Ambulatory Visit: Payer: Self-pay

## 2018-02-01 DIAGNOSIS — M549 Dorsalgia, unspecified: Secondary | ICD-10-CM

## 2018-02-01 DIAGNOSIS — D649 Anemia, unspecified: Secondary | ICD-10-CM

## 2018-02-01 DIAGNOSIS — R739 Hyperglycemia, unspecified: Secondary | ICD-10-CM

## 2018-02-01 DIAGNOSIS — I10 Essential (primary) hypertension: Secondary | ICD-10-CM

## 2018-02-01 LAB — BASIC METABOLIC PANEL
ANION GAP: 9 (ref 5–15)
BUN: 15 mg/dL (ref 8–23)
CALCIUM: 9.1 mg/dL (ref 8.9–10.3)
CO2: 26 mmol/L (ref 22–32)
Chloride: 101 mmol/L (ref 98–111)
Creatinine, Ser: 0.67 mg/dL (ref 0.44–1.00)
Glucose, Bld: 106 mg/dL — ABNORMAL HIGH (ref 70–99)
POTASSIUM: 3.4 mmol/L — AB (ref 3.5–5.1)
SODIUM: 136 mmol/L (ref 135–145)

## 2018-02-01 LAB — GLUCOSE, CAPILLARY: GLUCOSE-CAPILLARY: 102 mg/dL — AB (ref 70–99)

## 2018-02-01 LAB — CBC
HEMATOCRIT: 35.8 % — AB (ref 36.0–46.0)
HEMOGLOBIN: 11.6 g/dL — AB (ref 12.0–15.0)
MCH: 32.4 pg (ref 26.0–34.0)
MCHC: 32.4 g/dL (ref 30.0–36.0)
MCV: 100 fL (ref 78.0–100.0)
Platelets: 289 10*3/uL (ref 150–400)
RBC: 3.58 MIL/uL — ABNORMAL LOW (ref 3.87–5.11)
RDW: 14.1 % (ref 11.5–15.5)
WBC: 7.7 10*3/uL (ref 4.0–10.5)

## 2018-02-01 MED ORDER — OXYCODONE HCL 5 MG PO TABS
5.0000 mg | ORAL_TABLET | ORAL | Status: DC | PRN
  Administered 2018-02-01 – 2018-02-06 (×15): 5 mg via ORAL
  Filled 2018-02-01 (×16): qty 1

## 2018-02-01 MED ORDER — POTASSIUM CHLORIDE 10 MEQ/100ML IV SOLN
10.0000 meq | INTRAVENOUS | Status: AC
  Administered 2018-02-01 (×3): 10 meq via INTRAVENOUS
  Filled 2018-02-01 (×3): qty 100

## 2018-02-01 MED ORDER — LISINOPRIL 5 MG PO TABS
2.5000 mg | ORAL_TABLET | Freq: Every day | ORAL | Status: DC
  Administered 2018-02-02 – 2018-02-05 (×4): 2.5 mg via ORAL
  Administered 2018-02-06: 5 mg via ORAL
  Filled 2018-02-01 (×6): qty 1

## 2018-02-01 NOTE — Progress Notes (Signed)
PROGRESS NOTE    Teresa Bates  ZOX:[id number] DOB: 30-Jan-1930 DOA: 01/31/2018 PCP: Thurman Coyer, MD \  Brief Narrative:  HPI per Dr. Koren Bound on 01/31/18 Teresa Bates is a 84 y.o. Spanish-speaking female with medical history significant for A. fib on Eliquis, PPM placement in [facility]. Jude's [id number], serial #[id number]), Parkinson's disease, hypertension, depression/anxiety and chronic back pain who presents to the ED with 2 months of progressively worsening lower back pain that is stabbing in nature with radiation to the bilateral thighs and is limiting activities due to pain. History mostly obtained from patient's son, Sherilyn Cooter. No recent falls or trauma.  No focal weakness, numbness, tingling in either lower extremity.  No loss of bowel or bladder continence, no urinary retention.  No visual disturbance or change in speech.  There is no known history of malignancy.  Patient is currently visiting her son from [place], [place], and he was concerned about her worsening pain that was uncontrolled with home Norco.  ED Course: In the ED, patient was in severe pain requiring multiple doses of IV narcotics.  He was afebrile, mildly tachycardic, hypertensive and saturating comfortably on room air.  Labs showed no abnormality.  Urinalysis was unremarkable.  X-ray of the T and L-spine showed severe T9 fracture and L1-L5 compression fractures of unknown acuity.  Orthopedic surgery was consulted and recommended admission for pain control and likely nonoperative management of her lumbar compression fractures.   **Patient fortunately unable to go under MRI due to incompatibility with pacemaker.  Bone scan has been ordered and orthopedic recommending TLSO brace and will continue monitor patient's pain.  Assessment & Plan:   Active Problems:   Lumbar compression fracture (HCC)  Thoracic and lumbar compression fractures without cauda equina, initial encounter -Orthopedic surgery consulted and  recommending T LSO brace and is likely can be treated definitively with this however will need further imaging studies -MRI of the thoracic and lumbar spine is unable to be done secondary to incompatibility of pacemaker p.m. 2240 serial #[id number] with MRI machines -We will order a whole-body bone scan to further evaluate Acuity of Fractures since MRI cannot be done -Pain control with scheduled Tylenol, home Lyrica, as needed Oxycodone 5 mg q3hprn, low-dose 0.5 mg IV Dilaudid for breakthrough -Physical therapy evaluation following clearance from orthopedic surgery -Started calcium and vitamin D supplementation -Bowel regimen  A. Fib -Continue home Toprol XL 25 mg po Daily  -Switched Eliquis to Lovenox 1 mg/kg initially while awaiting formal decision on operative versus non-operative management -We will follow-up on bone scan ordered as above and resume Home Eliquis if non-operative treatment is recommended based on the imaging studies performed.   HTN -C/w Metoprolol Succinate 25 mg po Daily and Lisiniopril 2.5 mg p.o. nightly  Anxiety and Depression -Continue home Citalopram 20 mg po qHS  Hypokalemia -Patient's K+ this AM was 3.4 -Replete with IV KCl 30 mEQ -Continue to Monitor and Replete as Necessary -Repeat CMP in AM   Parkinson's  -Continue with Lyrica 75 mill grams p.o. twice daily along with pramipexole 0.25 mill grams p.o. 3 times daily  GERD -Continue with omeprazole substitution with Protonix 40 mg p.o. Daily  Normocytic Anemia -Patient's hemoglobin/hematocrit went from 12.4/38.8 and is now 11.6/35.8 -Check anemia panel a.m. -Continue monitor for signs and symptoms of bleeding -Repeat CBC in the a.m.  Hyperglycemia -Likely reactive -Patient blood sugar on daily BMPs has ranged from 93-106 -Check hemoglobin A1c -If blood sugars continually remain elevated will place on sensitive  NovoLog sliding scale AC  DVT prophylaxis: Anticoagulated with 1 mg/kg of enoxaparin  every 12 Code Status: FULL CODE Family Communication: Discussed with Daughter at Bedside  Disposition Plan: Pending further work-up and bone scans.  PT and OT will need to evaluate but likely anticipate discharge to skilled nursing facility in the next 24 to 48 hours if nonoperative management is required  Consultants:   Orthopedic Surgery Dr. Yevette Edwards  Procedures:   None  Antimicrobials:  Anti-infectives (From admission, onward)   None     Subjective: Seen and examined at bedside with the assistance of her family and states that she was in pain.  Denies any chest pain, lightheadedness or dizziness per complaint of back pain.  No other concerns or complaints and was not really moving because of her pain.  Objective: Vitals:   01/31/18 2239 02/01/18 0500 02/01/18 0504 02/01/18 1413  BP:   102/64 (!) 173/89  Pulse:   83 81  Resp:    14  Temp:   98.1 F (36.7 C) 98.4 F (36.9 C)  TempSrc:   Oral Oral  SpO2:   92% 90%  Weight: 46.2 kg 46.1 kg    Height: 5' (1.524 m)       Intake/Output Summary (Last 24 hours) at 02/01/2018 1638 Last data filed at 02/01/2018 1610 Gross per 24 hour  Intake 0 ml  Output 250 ml  Net -250 ml   Filed Weights   01/31/18 0943 01/31/18 2239 02/01/18 0500  Weight: 49.9 kg 46.2 kg 46.1 kg   Examination: Physical Exam:  Constitutional: Thin frail elderly Caucasian female in NAD and appears calm but uncomfortable Eyes: Lids and conjunctivae normal, sclerae anicteric  ENMT: External Ears, Nose appear normal. Grossly normal hearing. .  Neck: Appears normal, supple, no cervical masses, normal ROM, no appreciable thyromegaly, no JVD Respiratory: Diminished to auscultation bilaterally, no wheezing, rales, rhonchi or crackles. Normal respiratory effort and patient is not tachypenic. No accessory muscle use.  Cardiovascular: RRR, no murmurs / rubs / gallops. S1 and S2 auscultated. No extremity edema.  Abdomen: Soft, non-tender, non-distended. No masses  palpated. No appreciable hepatosplenomegaly. Bowel sounds positive x4.  GU: Deferred. Musculoskeletal: No clubbing / cyanosis of digits/nails. No joint deformity upper and lower extremities.  Skin: No rashes, lesions, ulcers on a limited skin evaluation. No induration; Warm and dry.  Neurologic: CN 2-12 grossly intact with no focal deficits. Romberg sign and cerebellar reflexes not assessed.  Psychiatric: Normal judgment and insight. Alert and oriented x 3. Normal mood and appropriate affect.   Data Reviewed: I have personally reviewed following labs and imaging studies  CBC: Recent Labs  Lab 01/31/18 1148 02/01/18 0454  WBC 9.3 7.7  NEUTROABS 8.0*  --   HGB 12.4 11.6*  HCT 38.8 35.8*  MCV 99.5 100.0  PLT 322 289   Basic Metabolic Panel: Recent Labs  Lab 01/31/18 1148 01/31/18 2215 02/01/18 0454  NA 142  --  136  K 3.8  --  3.4*  CL 103  --  101  CO2 28  --  26  GLUCOSE 93  --  106*  BUN 17  --  15  CREATININE 0.80  --  0.67  CALCIUM 9.7  --  9.1  MG  --  1.7  --   PHOS  --  3.3  --    GFR: Estimated Creatinine Clearance: 36.9 mL/min (by C-G formula based on SCr of 0.67 mg/dL). Liver Function Tests: Recent Labs  Lab 01/31/18 2215  AST 27  ALT 16  ALKPHOS 159*  BILITOT 0.9  PROT 6.8  ALBUMIN 3.3*   No results for input(s): LIPASE, AMYLASE in the last 168 hours. No results for input(s): AMMONIA in the last 168 hours. Coagulation Profile: Recent Labs  Lab 01/31/18 2215  INR 1.19   Cardiac Enzymes: No results for input(s): CKTOTAL, CKMB, CKMBINDEX, TROPONINI in the last 168 hours. BNP (last 3 results) No results for input(s): PROBNP in the last 8760 hours. HbA1C: No results for input(s): HGBA1C in the last 72 hours. CBG: Recent Labs  Lab 02/01/18 0729  GLUCAP 102*   Lipid Profile: No results for input(s): CHOL, HDL, LDLCALC, TRIG, CHOLHDL, LDLDIRECT in the last 72 hours. Thyroid Function Tests: No results for input(s): TSH, T4TOTAL, FREET4,  T3FREE, THYROIDAB in the last 72 hours. Anemia Panel: No results for input(s): VITAMINB12, FOLATE, FERRITIN, TIBC, IRON, RETICCTPCT in the last 72 hours. Sepsis Labs: No results for input(s): PROCALCITON, LATICACIDVEN in the last 168 hours.  No results found for this or any previous visit (from the past 240 hour(s)).   Radiology Studies: Dg Lumbar Spine 2-3 Views  Result Date: 01/31/2018 CLINICAL DATA:  Back pain for 2 months, abdominal pain for 1 month. History of compression fractures. EXAM: LUMBAR SPINE - 2-3 VIEW COMPARISON:  None. FINDINGS: Limited assessment due to severe osteopenia. Moderate L1, moderate L2, mild L3, moderate L4 and mild L5 compression fractures. Additional thoracic compression fractures, severe at T9. No malalignment. Maintenance of lumbar lordosis. Cardiac pacemaker wires in place. Included prevertebral and paraspinal soft tissue planes are non suspicious. IMPRESSION: 1. Limited assessment due to severe osteopenia. 2. Age indeterminate compression fractures all lumbar levels, moderate at L1 and L4. Severe T9 compression fracture. Electronically Signed   By: Awilda Metro M.D.   On: 01/31/2018 17:39   Scheduled Meds: . acetaminophen  500 mg Oral Q6H  . calcium-vitamin D  1 tablet Oral Q breakfast  . citalopram  20 mg Oral QHS  . docusate sodium  100 mg Oral BID  . enoxaparin (LOVENOX) injection  1 mg/kg Subcutaneous Q12H  . metoprolol succinate  25 mg Oral Daily  . pantoprazole  40 mg Oral Daily  . pramipexole  0.25 mg Oral TID  . pregabalin  75 mg Oral BID  . senna  1 tablet Oral BID   Continuous Infusions:   LOS: 1 day   Merlene Laughter, DO [facility] PAGER is on AMION  If 7PM-7AM, please contact night-coverage [email or web address] Password [facility] 02/01/2018, 4:38 PM

## 2018-02-01 NOTE — Progress Notes (Signed)
I was informed of patient's emergency department visit last night, and I have been in surgery throughout the day today until just now.  Specifically, I was informed that the patient had increasing pain, and did present to the emergency department as a result of her increasing pain.  Of note, the patient was evaluated by me in the office just over a week ago for her pain.  She was noted to have multiple compression fractures involving T9, L1, L2, L3, L4, and L5.  The acuity of these fractures were unknown, and continue to be unknown.  I do feel that the patient is likely going to be treated definitively in a TLSO brace.  However, my recommendation at the time of her office visit was that we proceed with an MRI of her thoracic and lumbar spine if possible, and if an MRI is not possible, a bone scan would be needed.  This would help [place] determine the acuity of her multiple compression fractures.  This was my recommendation at the time of her evaluation in the office, and this does continue to be my recommendation.  I did communicate this with the patient's nurse, Victorino Dike.  It appears she still has not been fitted for TLSO brace.  I do feel that providing her brace would certainly help control her pain.  This was expressed to her nurse as well.  I will happily provide additional updated recommendations once her imaging studies have been performed.

## 2018-02-02 ENCOUNTER — Inpatient Hospital Stay (HOSPITAL_COMMUNITY): Payer: Medicare HMO

## 2018-02-02 DIAGNOSIS — F329 Major depressive disorder, single episode, unspecified: Secondary | ICD-10-CM

## 2018-02-02 DIAGNOSIS — R7303 Prediabetes: Secondary | ICD-10-CM

## 2018-02-02 DIAGNOSIS — F419 Anxiety disorder, unspecified: Secondary | ICD-10-CM

## 2018-02-02 DIAGNOSIS — I4891 Unspecified atrial fibrillation: Secondary | ICD-10-CM

## 2018-02-02 LAB — MAGNESIUM: MAGNESIUM: 1.7 mg/dL (ref 1.7–2.4)

## 2018-02-02 LAB — CBC WITH DIFFERENTIAL/PLATELET
Basophils Absolute: 0.1 10*3/uL (ref 0.0–0.1)
Basophils Relative: 1 %
Eosinophils Absolute: 0.2 10*3/uL (ref 0.0–0.7)
Eosinophils Relative: 3 %
HCT: 36.7 % (ref 36.0–46.0)
Hemoglobin: 11.8 g/dL — ABNORMAL LOW (ref 12.0–15.0)
LYMPHS ABS: 1.3 10*3/uL (ref 0.7–4.0)
LYMPHS PCT: 22 %
MCH: 32.2 pg (ref 26.0–34.0)
MCHC: 32.2 g/dL (ref 30.0–36.0)
MCV: 100 fL (ref 78.0–100.0)
MONO ABS: 0.7 10*3/uL (ref 0.1–1.0)
MONOS PCT: 11 %
Neutro Abs: 3.8 10*3/uL (ref 1.7–7.7)
Neutrophils Relative %: 63 %
PLATELETS: 299 10*3/uL (ref 150–400)
RBC: 3.67 MIL/uL — ABNORMAL LOW (ref 3.87–5.11)
RDW: 14.2 % (ref 11.5–15.5)
WBC: 6 10*3/uL (ref 4.0–10.5)

## 2018-02-02 LAB — COMPREHENSIVE METABOLIC PANEL
ALT: 13 U/L (ref 0–44)
AST: 23 U/L (ref 15–41)
Albumin: 2.6 g/dL — ABNORMAL LOW (ref 3.5–5.0)
Alkaline Phosphatase: 121 U/L (ref 38–126)
Anion gap: 10 (ref 5–15)
BUN: 21 mg/dL (ref 8–23)
CHLORIDE: 102 mmol/L (ref 98–111)
CO2: 25 mmol/L (ref 22–32)
Calcium: 9 mg/dL (ref 8.9–10.3)
Creatinine, Ser: 0.66 mg/dL (ref 0.44–1.00)
GFR calc non Af Amer: >60 mL/min (ref >60)
Glucose, Bld: 96 mg/dL (ref 70–99)
POTASSIUM: 3.7 mmol/L (ref 3.5–5.1)
Sodium: 137 mmol/L (ref 135–145)
Total Bilirubin: 0.7 mg/dL (ref 0.3–1.2)
Total Protein: 5.6 g/dL — ABNORMAL LOW (ref 6.5–8.1)

## 2018-02-02 LAB — RETICULOCYTES
RBC.: 3.67 MIL/uL — ABNORMAL LOW (ref 3.87–5.11)
Retic Count, Absolute: 44 10*3/uL (ref 19.0–186.0)
Retic Ct Pct: 1.2 % (ref 0.4–3.1)

## 2018-02-02 LAB — IRON AND TIBC
IRON: 45 ug/dL (ref 28–170)
Saturation Ratios: 22 % (ref 10.4–31.8)
TIBC: 208 ug/dL — ABNORMAL LOW (ref 250–450)
UIBC: 163 ug/dL

## 2018-02-02 LAB — HEMOGLOBIN A1C
HEMOGLOBIN A1C: 5.8 % — AB (ref 4.8–5.6)
Mean Plasma Glucose: 119.76 mg/dL

## 2018-02-02 LAB — VITAMIN B12: Vitamin B-12: 414 pg/mL (ref 180–914)

## 2018-02-02 LAB — PHOSPHORUS: PHOSPHORUS: 3.3 mg/dL (ref 2.5–4.6)

## 2018-02-02 LAB — FOLATE: FOLATE: 5.3 ng/mL — AB (ref >5.9)

## 2018-02-02 LAB — FERRITIN: Ferritin: 371 ng/mL — ABNORMAL HIGH (ref 11–307)

## 2018-02-02 LAB — GLUCOSE, CAPILLARY: GLUCOSE-CAPILLARY: 89 mg/dL (ref 70–99)

## 2018-02-02 MED ORDER — TECHNETIUM TC 99M MEDRONATE IV KIT
21.3000 | PACK | Freq: Once | INTRAVENOUS | Status: AC | PRN
  Administered 2018-02-02: 21.3 via INTRAVENOUS

## 2018-02-02 NOTE — Clinical Social Work Note (Signed)
Clinical Social Work Assessment  Patient Details  Name: Teresa Bates MRN: [id number] Date of Birth: [birth year]  Date of referral:  02/02/18               Reason for consult:  Discharge Planning                Permission sought to share information with:  Facility Sport and exercise psychologist, Family Supports Permission granted to share information::  Yes, Verbal Permission Granted  Name::       [place], Armed forces training and education officer::  SNF  Relationship::  Son/ Daughter in Financial trader Information:    [phone]  Housing/Transportation Living arrangements for the past 2 months:  Single Family Home Source of Information:  Patient Patient Interpreter Needed:  None Criminal Activity/Legal Involvement Pertinent to Current Situation/Hospitalization:  No - Comment as needed Significant Relationships:  Adult Children Lives with:  Adult Children Do you feel safe going back to the place where you live?  No Need for family participation in patient care:  Yes  Care giving concerns:   Patient admitted for back pain. X-ray of the T and L-spine showed severe T9 fracture and L1-L5 compression fractures of unknown acuity.  Orthopedic surgery was consulted and recommended admission for pain control and likely nonoperative management of her lumbar compression fractures.  SNF placement for for short rehab.  Patient family considering transitioning the patient to ALF vs.SNF after rehab.    Social Worker assessment / plan:  CSW met with the patient family at bedside to discuss discharge plan. Patient son reports they have transitioned the patient from [place] to live here in [place]. Patient son is concerned they cannot continue to care for her at home due to her required level of care at this time. He express about two months ago before the fracture the patient was very independent and active. The patient now needs assistance with all of her ADL's.   Patient son inquired about SNF process and the facilities in the  local area. CSW provided a list of bed offers and explain SNF option. He palns to visit facilities this weekend and is hopeful to choice by Monday.  CSW explain the patient will need [facility] insurance authorization before she is able to discharge to SNF.   PT/OT-Pending.   Plan: SNF  Employment status:    Insurance information:  Medicare PT Recommendations:  [place] / Referral to community resources:  [place]  Patient/Family's Response to care:  Agreeable to care.   Patient/Family's Understanding of and Emotional Response to Diagnosis, Current Treatment, and Prognosis:  Patient alert and oriented. Patient is spanish speaking and defers to her son for understanding of her care.   Emotional Assessment Appearance:  Appears stated age Attitude/Demeanor/Rapport:    Affect (typically observed):  Calm Orientation:  Oriented to Self, Oriented to Place, Oriented to  Time, Oriented to Situation Alcohol / Substance use:  Not Applicable Psych involvement (Current and /or in the community):  No (Comment)  Discharge Needs  Concerns to be addressed:  Discharge Planning Concerns Readmission within the last 30 days:  No Current discharge risk:  Physical Impairment, Dependent with Mobility Barriers to Discharge:  Continued Medical Work up, [place], LCSW 02/02/2018, 2:54 PM

## 2018-02-02 NOTE — Progress Notes (Signed)
PROGRESS NOTE    Teresa Bates  VOZ:[id number] DOB: 1929/10/15 DOA: 01/31/2018 PCP: Thurman Coyer, MD \  Brief Narrative:  HPI per Dr. Koren Bound on 01/31/18 Teresa Bates is a 84 y.o. Spanish-speaking female with medical history significant for A. fib on Eliquis, PPM placement in 2015 [facility]. Jude's [id number], serial #[id number]), Parkinson's disease, hypertension, depression/anxiety and chronic back pain who presents to the ED with 2 months of progressively worsening lower back pain that is stabbing in nature with radiation to the bilateral thighs and is limiting activities due to pain. History mostly obtained from patient's son, Teresa Bates. No recent falls or trauma.  No focal weakness, numbness, tingling in either lower extremity.  No loss of bowel or bladder continence, no urinary retention.  No visual disturbance or change in speech.  There is no known history of malignancy.  Patient is currently visiting her son from [place], [place], and he was concerned about her worsening pain that was uncontrolled with home Norco.  ED Course: In the ED, patient was in severe pain requiring multiple doses of IV narcotics.  He was afebrile, mildly tachycardic, hypertensive and saturating comfortably on room air.  Labs showed no abnormality.  Urinalysis was unremarkable.  X-ray of the T and L-spine showed severe T9 fracture and L1-L5 compression fractures of unknown acuity.  Orthopedic surgery was consulted and recommended admission for pain control and likely nonoperative management of her lumbar compression fractures.   **Patient fortunately unable to go under MRI due to incompatibility with pacemaker.  Bone scan has been ordered and orthopedic recommending TLSO brace and will continue monitor patient's pain. Bone Scan to be done later today and will follow. Patient states pain is better but worse when she moves.   Assessment & Plan:   Active Problems:   Lumbar compression fracture (HCC)  Thoracic and  Lumbar Compression Fractures without Cauda Equina -Orthopedic surgery consulted and recommending TLSO brace and is likely can be treated definitively with this however will need further imaging studies; patient unable to tolerate TLSO brace while laying flat in bed -MRI of the thoracic and lumbar spine is unable to be done secondary to incompatibility of pacemaker p.m. 2240 serial #[id number] with MRI machines -We will order a whole-body bone scan to further evaluate Acuity of Fractures since MRI cannot be done; Bone scan still currently awaiting to be done  -Pain control with scheduled Tylenol, home Lyrica, as needed Oxycodone 5 mg q3hprn, low-dose 0.5 mg IV Dilaudid for breakthrough -Physical therapy evaluation following clearance from Orthopedic Surgery -Started calcium and vitamin D supplementation -Bowel regimen -Social worker involved for assistance with placement  A. Fib -Continue home Toprol XL 25 mg po Daily  -Switched Eliquis to Lovenox 1 mg/kg initially while awaiting formal decision on operative versus non-operative management -We will follow-up on bone scan ordered as above and resume Home Eliquis if non-operative treatment is recommended based on the imaging studies performed.   HTN -C/w Metoprolol Succinate 25 mg po Daily and Lisiniopril 2.5 mg p.o. nightly  Anxiety and Depression -Continue home Citalopram 20 mg po qHS  Hypokalemia -Patient's K+ was 3.4 and improved to 3.7 -Replete with IV KCl 30 mEQ yesterday -Continue to Monitor and Replete as Necessary -Repeat CMP in AM   Parkinson's  -Continue with Lyrica 75 mill grams p.o. twice daily along with pramipexole 0.25 mill grams p.o. 3 times daily  GERD -Continue with Omeprazole substitution with Protonix 40 mg p.o. Daily  Normocytic Anemia -Patient's hemoglobin/hematocrit went from 12.4/38.8  and is now 11.8/36.7 -Checked Anemia Panel and showed an iron level 45, U IBC 163, TIBC of 208, saturation ratio is 22%,  ferritin level of 371, folate level of 5.3, vitamin B12 level 414 -Continue monitor for signs and symptoms of bleeding -Repeat CBC in the a.m.  Hyperglycemia in the setting of Impaired Fasting Glucose/Prediabetes -Patient blood sugar on daily BMPs has ranged from 93-106 -Checked hemoglobin A1c and was 5.8 -If blood sugars continually remain elevated will place on sensitive NovoLog sliding scale AC  DVT prophylaxis: Anticoagulated with 1 mg/kg of enoxaparin every 12h currently Code Status: FULL CODE Family Communication: Discussed with Son at Bedside  Disposition Plan: Pending further work-up and bone scan.  PT and OT will need to evaluate but likely anticipate discharge to skilled nursing facility in the next 24 to 48 hours if nonoperative management is required  Consultants:   Orthopedic Surgery Dr. Yevette Edwards  Procedures:   None  Antimicrobials:  Anti-infectives (From admission, onward)   None     Subjective: Seen and examined at bedside with the assistance of her son translating some she states that her pain in her back feels better today than yesterday.  States it hurts worse when she tries to move or ambulate or get up.  Also was complaining of abdominal pain secondary to her Lovenox injection.  No other concerns or complaints at this time and denies chest pain, lightheadedness or dizziness.  Objective: Vitals:   02/01/18 2136 02/02/18 0438 02/02/18 1010 02/02/18 1010  BP: 98/67 (!) 150/87 134/74 134/74  Pulse: 77 76  76  Resp: 16 16  16   Temp: 98 F (36.7 C) 97.6 F (36.4 C)  98.2 F (36.8 C)  TempSrc: Oral Oral  Oral  SpO2: 90% 95%  93%  Weight:      Height:        Intake/Output Summary (Last 24 hours) at 02/02/2018 1258 Last data filed at 02/02/2018 0540 Gross per 24 hour  Intake 484.38 ml  Output -  Net 484.38 ml   Filed Weights   01/31/18 0943 01/31/18 2239 02/01/18 0500  Weight: 49.9 kg 46.2 kg 46.1 kg   Examination: Physical Exam:  Constitutional:  Thin, frail, elderly female in no acute distress who appears calm but still slightly looks uncomfortable Eyes: Sclera anicteric.  Lids and conjunctive normal ENMT: External ears and nose appear normal.  Grossly normal hearing Neck: Neck appears supple no JVD Respiratory: Diminished to auscultation bilaterally no appreciable wheezing, rales, rhonchi.  Patient not tachypneic using accessory muscles to breathe Cardiovascular: Regular rate and rhythm.  No appreciable murmurs, rubs, gallops.  No lower extremity edema Abdomen: Soft, nontender, nondistended.  Bowel sounds present all 4 quadrants GU: Deferred Musculoskeletal: No clubbing or cyanosis.  No joint deformities noted Skin: Skin is warm and dry no appreciable rashes or lesions on limited skin evaluation Neurologic: Cranial nerves II through XII grossly intact no appreciable focal deficits Psychiatric: Normal mood and affect.  Intact judgment insight.  Patient is awake, alert and oriented.  Data Reviewed: I have personally reviewed following labs and imaging studies  CBC: Recent Labs  Lab 01/31/18 1148 02/01/18 0454 02/02/18 0452  WBC 9.3 7.7 6.0  NEUTROABS 8.0*  --  3.8  HGB 12.4 11.6* 11.8*  HCT 38.8 35.8* 36.7  MCV 99.5 100.0 100.0  PLT 322 289 299   Basic Metabolic Panel: Recent Labs  Lab 01/31/18 1148 01/31/18 2215 02/01/18 0454 02/02/18 0452  NA 142  --  136 137  K 3.8  --  3.4* 3.7  CL 103  --  101 102  CO2 28  --  26 25  GLUCOSE 93  --  106* 96  BUN 17  --  15 21  CREATININE 0.80  --  0.67 0.66  CALCIUM 9.7  --  9.1 9.0  MG  --  1.7  --  1.7  PHOS  --  3.3  --  3.3   GFR: Estimated Creatinine Clearance: 36.9 mL/min (by C-G formula based on SCr of 0.66 mg/dL). Liver Function Tests: Recent Labs  Lab 01/31/18 2215 02/02/18 0452  AST 27 23  ALT 16 13  ALKPHOS 159* 121  BILITOT 0.9 0.7  PROT 6.8 5.6*  ALBUMIN 3.3* 2.6*   No results for input(s): LIPASE, AMYLASE in the last 168 hours. No results for  input(s): AMMONIA in the last 168 hours. Coagulation Profile: Recent Labs  Lab 01/31/18 2215  INR 1.19   Cardiac Enzymes: No results for input(s): CKTOTAL, CKMB, CKMBINDEX, TROPONINI in the last 168 hours. BNP (last 3 results) No results for input(s): PROBNP in the last 8760 hours. HbA1C: Recent Labs    02/02/18 0452  HGBA1C 5.8*   CBG: Recent Labs  Lab 02/01/18 0729 02/02/18 0729  GLUCAP 102* 89   Lipid Profile: No results for input(s): CHOL, HDL, LDLCALC, TRIG, CHOLHDL, LDLDIRECT in the last 72 hours. Thyroid Function Tests: No results for input(s): TSH, T4TOTAL, FREET4, T3FREE, THYROIDAB in the last 72 hours. Anemia Panel: Recent Labs    02/02/18 0452  VITAMINB12 414  FOLATE 5.3*  FERRITIN 371*  TIBC 208*  IRON 45  RETICCTPCT 1.2   Sepsis Labs: No results for input(s): PROCALCITON, LATICACIDVEN in the last 168 hours.  No results found for this or any previous visit (from the past 240 hour(s)).   Radiology Studies: Dg Lumbar Spine 2-3 Views  Result Date: 01/31/2018 CLINICAL DATA:  Back pain for 2 months, abdominal pain for 1 month. History of compression fractures. EXAM: LUMBAR SPINE - 2-3 VIEW COMPARISON:  None. FINDINGS: Limited assessment due to severe osteopenia. Moderate L1, moderate L2, mild L3, moderate L4 and mild L5 compression fractures. Additional thoracic compression fractures, severe at T9. No malalignment. Maintenance of lumbar lordosis. Cardiac pacemaker wires in place. Included prevertebral and paraspinal soft tissue planes are non suspicious. IMPRESSION: 1. Limited assessment due to severe osteopenia. 2. Age indeterminate compression fractures all lumbar levels, moderate at L1 and L4. Severe T9 compression fracture. Electronically Signed   By: Awilda Metro M.D.   On: 01/31/2018 17:39   Scheduled Meds: . acetaminophen  500 mg Oral Q6H  . calcium-vitamin D  1 tablet Oral Q breakfast  . citalopram  20 mg Oral QHS  . docusate sodium  100 mg  Oral BID  . enoxaparin (LOVENOX) injection  1 mg/kg Subcutaneous Q12H  . lisinopril  2.5 mg Oral QHS  . metoprolol succinate  25 mg Oral Daily  . pantoprazole  40 mg Oral Daily  . pramipexole  0.25 mg Oral TID  . pregabalin  75 mg Oral BID  . senna  1 tablet Oral BID   Continuous Infusions:   LOS: 2 days   Merlene Laughter, DO [facility] PAGER is on AMION  If 7PM-7AM, please contact night-coverage [email or web address] Password [email or web address] 02/02/2018, 12:58 PM

## 2018-02-03 LAB — COMPREHENSIVE METABOLIC PANEL
ALT: 13 U/L (ref 0–44)
AST: 27 U/L (ref 15–41)
Albumin: 2.7 g/dL — ABNORMAL LOW (ref 3.5–5.0)
Alkaline Phosphatase: 126 U/L (ref 38–126)
Anion gap: 9 (ref 5–15)
BUN: 20 mg/dL (ref 8–23)
CO2: 24 mmol/L (ref 22–32)
Calcium: 9.1 mg/dL (ref 8.9–10.3)
Chloride: 103 mmol/L (ref 98–111)
Creatinine, Ser: 0.76 mg/dL (ref 0.44–1.00)
GFR calc Af Amer: >60 mL/min (ref >60)
Glucose, Bld: 98 mg/dL (ref 70–99)
POTASSIUM: 4 mmol/L (ref 3.5–5.1)
Sodium: 136 mmol/L (ref 135–145)
Total Bilirubin: 0.8 mg/dL (ref 0.3–1.2)
Total Protein: 5.7 g/dL — ABNORMAL LOW (ref 6.5–8.1)

## 2018-02-03 LAB — CBC WITH DIFFERENTIAL/PLATELET
BASOS ABS: 0 10*3/uL (ref 0.0–0.1)
Basophils Relative: 1 %
Eosinophils Absolute: 0.2 10*3/uL (ref 0.0–0.7)
Eosinophils Relative: 2 %
HEMATOCRIT: 37.7 % (ref 36.0–46.0)
Hemoglobin: 12.2 g/dL (ref 12.0–15.0)
LYMPHS ABS: 1.5 10*3/uL (ref 0.7–4.0)
LYMPHS PCT: 24 %
MCH: 31.9 pg (ref 26.0–34.0)
MCHC: 32.4 g/dL (ref 30.0–36.0)
MCV: 98.7 fL (ref 78.0–100.0)
MONO ABS: 0.9 10*3/uL (ref 0.1–1.0)
Monocytes Relative: 14 %
NEUTROS ABS: 3.7 10*3/uL (ref 1.7–7.7)
Neutrophils Relative %: 59 %
Platelets: 317 10*3/uL (ref 150–400)
RBC: 3.82 MIL/uL — ABNORMAL LOW (ref 3.87–5.11)
RDW: 14.1 % (ref 11.5–15.5)
WBC: 6.2 10*3/uL (ref 4.0–10.5)

## 2018-02-03 LAB — GLUCOSE, CAPILLARY: Glucose-Capillary: 95 mg/dL (ref 70–99)

## 2018-02-03 LAB — MAGNESIUM: MAGNESIUM: 1.7 mg/dL (ref 1.7–2.4)

## 2018-02-03 LAB — PHOSPHORUS: Phosphorus: 3.3 mg/dL (ref 2.5–4.6)

## 2018-02-03 NOTE — Progress Notes (Signed)
PROGRESS NOTE    Teresa Bates  ZOX:[id number] DOB: 11-29-1929 DOA: 01/31/2018 PCP: Thurman Coyer, MD \  Brief Narrative:  HPI per Dr. Koren Bound on 01/31/18 Teresa Bates is a 84 y.o. Spanish-speaking female with medical history significant for A. fib on Eliquis, PPM placement in 2015 [facility]. Jude's [id number], serial #[id number]), Parkinson's disease, hypertension, depression/anxiety and chronic back pain who presents to the ED with 2 months of progressively worsening lower back pain that is stabbing in nature with radiation to the bilateral thighs and is limiting activities due to pain. History mostly obtained from patient's son, Teresa Bates. No recent falls or trauma.  No focal weakness, numbness, tingling in either lower extremity.  No loss of bowel or bladder continence, no urinary retention.  No visual disturbance or change in speech.  There is no known history of malignancy.  Patient is currently visiting her son from [place], [place], and he was concerned about her worsening pain that was uncontrolled with home Norco.  ED Course: In the ED, patient was in severe pain requiring multiple doses of IV narcotics.  He was afebrile, mildly tachycardic, hypertensive and saturating comfortably on room air.  Labs showed no abnormality.  Urinalysis was unremarkable.  X-ray of the T and L-spine showed severe T9 fracture and L1-L5 compression fractures of unknown acuity.  Orthopedic surgery was consulted and recommended admission for pain control and likely nonoperative management of her lumbar compression fractures.   **Patient fortunately unable to go under MRI due to incompatibility with pacemaker.  Bone scan has been done.  Orthopedic surgery to evaluate and review bone scan and make further recommendations.  PT/OT to be done after orthopedic recommendations.  Plan is to discharge to skilled nursing facility eventually.   Assessment & Plan:   Active Problems:   Lumbar compression fracture  (HCC)  Thoracic and Lumbar Compression Fractures without Cauda Equina -Orthopedic surgery consulted and recommending TLSO brace and is likely can be treated definitively with this however will need further imaging studies; patient unable to tolerate TLSO brace while laying flat in bed -MRI of the thoracic and lumbar spine is unable to be done secondary to incompatibility of pacemaker p.m. 2240 serial #[id number] with MRI machines -We will order a whole-body bone scan to further evaluate Acuity of Fractures since MRI cannot be done; Bone scan done and showed multiple healing bilateral rib fractures, healing T7, T9, T11, and L2 vertebral compression fractures, and degenerative change or healing focal fracture in the left medial tibial plateau -Pain control with scheduled Tylenol, home Lyrica, as needed Oxycodone 5 mg q3hprn, low-dose 0.5 mg IV Dilaudid for breakthrough -Physical therapy evaluation following clearance from Orthopedic Surgery still yet to be done -Started calcium and vitamin D supplementation -Bowel regimen -Social worker involved for assistance with placement offered patient and her family bed offers but will need insurance authorization  A. Fib -Continue home Toprol XL 25 mg po Daily  -Switched Eliquis to Lovenox 1 mg/kg initially while awaiting formal decision on operative versus non-operative management -We will have Orthopedic Surgery follow-up on bone scan ordered as above and resume Home Eliquis if non-operative treatment is recommended based on the imaging studies performed.   HTN -C/w Metoprolol Succinate 25 mg po Daily and Lisiniopril 2.5 mg p.o. nightly  Anxiety and Depression -Continue home Citalopram 20 mg po qHS  Hypokalemia -Patient's K+ was 3.4 and improved to 4.0 -Continue to Monitor and Replete as Necessary -Repeat CMP in AM   Parkinson's  -Continue with Lyrica  75 mg p.o. twice daily along with Pramipexole 0.25 mg p.o. 3 times daily  GERD -Continue with  Omeprazole substitution with Protonix 40 mg p.o. Daily  Normocytic Anemia -Patient's hemoglobin/hematocrit went from 12.4/38.8 and is now 12.2/37.7 -Checked Anemia Panel and showed an iron level 45, U IBC 163, TIBC of 208, saturation ratio is 22%, ferritin level of 371, folate level of 5.3, vitamin B12 level 414 -Continue monitor for signs and symptoms of bleeding -Repeat CBC in the a.m.  Hyperglycemia in the setting of Impaired Fasting Glucose/PreDiabetes -Patient blood sugar on daily BMPs has ranged from 93-106 -Checked hemoglobin A1c and was 5.8 -If blood sugars continually remain elevated will place on sensitive NovoLog sliding scale AC  DVT prophylaxis: Anticoagulated with 1 mg/kg of enoxaparin every 12h currently Code Status: FULL CODE Family Communication: No family present at bedside  Disposition Plan: Pending further work-up and bone scan review by Dr. Merlyn Albert.  PT and OT will need to evaluate but likely anticipate discharge to skilled nursing facility in the next 24 to 48 hours if nonoperative management is required  Consultants:   Orthopedic Surgery Dr. Yevette Edwards  Procedures:   None  Antimicrobials:  Anti-infectives (From admission, onward)   None     Subjective: Seen and examined at bedside and thinks that her pain is better.  States it is worse when she is moving around.  No chest pain, lightheadedness or dizziness.  No other concerns or complaints at this time and no family is present at bedside.  Objective: Vitals:   02/02/18 1010 02/02/18 1010 02/02/18 2053 02/03/18 1333  BP: 134/74 134/74 (!) 149/95 107/60  Pulse:  76 89 78  Resp:  16 15   Temp:  98.2 F (36.8 C) 98.2 F (36.8 C) 98.4 F (36.9 C)  TempSrc:  Oral Oral Oral  SpO2:  93% 91% 92%  Weight:      Height:        Intake/Output Summary (Last 24 hours) at 02/03/2018 1600 Last data filed at 02/03/2018 0715 Gross per 24 hour  Intake 60 ml  Output 100 ml  Net -40 ml   Filed Weights   01/31/18  0943 01/31/18 2239 02/01/18 0500  Weight: 49.9 kg 46.2 kg 46.1 kg   Examination: Physical Exam:  Constitutional: Thin, frail, elderly female currently no acute distress appears calm but still looks uncomfortable laying in bed Eyes: Sclera anicteric.  Lids and conjunctive are normal ENMT: External ears and nose appear normal Neck: Appears supple no JVD Respiratory: Diminished to auscultation bilaterally no appreciable wheezing, rales, rhonchi.  Patient not tachypneic wheezing excess muscle breathe Cardiovascular: Rate and rhythm.  No appreciable murmurs, rubs, gallops.  No lower extremity edema noted Abdomen: Soft, nontender, nondistended.  Bowel sounds present all 4 quadrants GU: Deferred Musculoskeletal: No contractures or cyanosis.  No joint deformities Skin: Is warm and dry no appreciable rashes or lesions limited skin evaluation Neurologic: Cranial nerves II through XII grossly intact no appreciable focal deficits Psychiatric: Normal mood and affect.  Intact judgment insight.  Patient is awake and alert.  Data Reviewed: I have personally reviewed following labs and imaging studies  CBC: Recent Labs  Lab 01/31/18 1148 02/01/18 0454 02/02/18 0452 02/03/18 0344  WBC 9.3 7.7 6.0 6.2  NEUTROABS 8.0*  --  3.8 3.7  HGB 12.4 11.6* 11.8* 12.2  HCT 38.8 35.8* 36.7 37.7  MCV 99.5 100.0 100.0 98.7  PLT 322 289 299 317   Basic Metabolic Panel: Recent Labs  Lab 01/31/18 1148  01/31/18 2215 02/01/18 0454 02/02/18 0452 02/03/18 0344  NA 142  --  136 137 136  K 3.8  --  3.4* 3.7 4.0  CL 103  --  101 102 103  CO2 28  --  26 25 24   GLUCOSE 93  --  106* 96 98  BUN 17  --  15 21 20   CREATININE 0.80  --  0.67 0.66 0.76  CALCIUM 9.7  --  9.1 9.0 9.1  MG  --  1.7  --  1.7 1.7  PHOS  --  3.3  --  3.3 3.3   GFR: Estimated Creatinine Clearance: 36.9 mL/min (by C-G formula based on SCr of 0.76 mg/dL). Liver Function Tests: Recent Labs  Lab 01/31/18 2215 02/02/18 0452 02/03/18 0344   AST 27 23 27   ALT 16 13 13   ALKPHOS 159* 121 126  BILITOT 0.9 0.7 0.8  PROT 6.8 5.6* 5.7*  ALBUMIN 3.3* 2.6* 2.7*   No results for input(s): LIPASE, AMYLASE in the last 168 hours. No results for input(s): AMMONIA in the last 168 hours. Coagulation Profile: Recent Labs  Lab 01/31/18 2215  INR 1.19   Cardiac Enzymes: No results for input(s): CKTOTAL, CKMB, CKMBINDEX, TROPONINI in the last 168 hours. BNP (last 3 results) No results for input(s): PROBNP in the last 8760 hours. HbA1C: Recent Labs    02/02/18 0452  HGBA1C 5.8*   CBG: Recent Labs  Lab 02/01/18 0729 02/02/18 0729 02/03/18 0757  GLUCAP 102* 89 95   Lipid Profile: No results for input(s): CHOL, HDL, LDLCALC, TRIG, CHOLHDL, LDLDIRECT in the last 72 hours. Thyroid Function Tests: No results for input(s): TSH, T4TOTAL, FREET4, T3FREE, THYROIDAB in the last 72 hours. Anemia Panel: Recent Labs    02/02/18 0452  VITAMINB12 414  FOLATE 5.3*  FERRITIN 371*  TIBC 208*  IRON 45  RETICCTPCT 1.2   Sepsis Labs: No results for input(s): PROCALCITON, LATICACIDVEN in the last 168 hours.  No results found for this or any previous visit (from the past 240 hour(s)).   Radiology Studies: Nm Bone Scan Whole Body  Result Date: 02/02/2018 CLINICAL DATA:  Mid and bilateral lower back pain for the past month. The patient fell in January 2019 and again in August 2019. History of severe arthritis and osteoporosis. EXAM: NUCLEAR MEDICINE WHOLE BODY BONE SCAN TECHNIQUE: Whole body anterior and posterior images were obtained approximately 3 hours after intravenous injection of radiopharmaceutical. RADIOPHARMACEUTICALS:  21.3 mCi Technetium-25m MDP IV COMPARISON:  Lumbar spine radiographs dated 01/31/2018. FINDINGS: Focal areas of increased tracer uptake in multiple bilateral ribs, aligned in a linear fashion from 1 rib to the necks. Increased tracer uptake in the T7, T9, T11 and L2 vertebral bodies with flattening of the vertebral  bodies. Focally increased tracer activity in the medial left knee at the medial tibial plateau. Normal renal and bladder activity. IMPRESSION: 1. Multiple healing bilateral rib fractures. 2. Healing T7, T9, T11 and L2 vertebral compression fractures. 3. Degenerative change or healing focal fracture in the left medial tibial plateau. Electronically Signed   By: Beckie Salts M.D.   On: 02/02/2018 17:22   Scheduled Meds: . acetaminophen  500 mg Oral Q6H  . calcium-vitamin D  1 tablet Oral Q breakfast  . citalopram  20 mg Oral QHS  . docusate sodium  100 mg Oral BID  . enoxaparin (LOVENOX) injection  1 mg/kg Subcutaneous Q12H  . lisinopril  2.5 mg Oral QHS  . metoprolol succinate  25 mg Oral Daily  .  pantoprazole  40 mg Oral Daily  . pramipexole  0.25 mg Oral TID  . pregabalin  75 mg Oral BID  . senna  1 tablet Oral BID   Continuous Infusions:   LOS: 3 days   Merlene Laughter, DO [facility] PAGER is on AMION  If 7PM-7AM, please contact night-coverage [email or web address] Password [email or web address] 02/03/2018, 4:00 PM

## 2018-02-04 DIAGNOSIS — K5900 Constipation, unspecified: Secondary | ICD-10-CM

## 2018-02-04 LAB — CBC WITH DIFFERENTIAL/PLATELET
Basophils Absolute: 0.1 10*3/uL (ref 0.0–0.1)
Basophils Relative: 1 %
Eosinophils Absolute: 0.1 10*3/uL (ref 0.0–0.7)
Eosinophils Relative: 3 %
HEMATOCRIT: 37.4 % (ref 36.0–46.0)
HEMOGLOBIN: 12 g/dL (ref 12.0–15.0)
LYMPHS ABS: 1.5 10*3/uL (ref 0.7–4.0)
Lymphocytes Relative: 30 %
MCH: 31.7 pg (ref 26.0–34.0)
MCHC: 32.1 g/dL (ref 30.0–36.0)
MCV: 98.9 fL (ref 78.0–100.0)
Monocytes Absolute: 0.5 10*3/uL (ref 0.1–1.0)
Monocytes Relative: 10 %
NEUTROS ABS: 2.8 10*3/uL (ref 1.7–7.7)
NEUTROS PCT: 56 %
Platelets: 310 10*3/uL (ref 150–400)
RBC: 3.78 MIL/uL — AB (ref 3.87–5.11)
RDW: 14.2 % (ref 11.5–15.5)
WBC: 5 10*3/uL (ref 4.0–10.5)

## 2018-02-04 LAB — COMPREHENSIVE METABOLIC PANEL
ALK PHOS: 125 U/L (ref 38–126)
ALT: 16 U/L (ref 0–44)
AST: 29 U/L (ref 15–41)
Albumin: 2.9 g/dL — ABNORMAL LOW (ref 3.5–5.0)
Anion gap: 7 (ref 5–15)
BILIRUBIN TOTAL: 0.8 mg/dL (ref 0.3–1.2)
BUN: 17 mg/dL (ref 8–23)
CALCIUM: 9.4 mg/dL (ref 8.9–10.3)
CO2: 27 mmol/L (ref 22–32)
CREATININE: 0.72 mg/dL (ref 0.44–1.00)
Chloride: 106 mmol/L (ref 98–111)
GFR calc non Af Amer: >60 mL/min (ref >60)
Glucose, Bld: 98 mg/dL (ref 70–99)
Potassium: 3.8 mmol/L (ref 3.5–5.1)
SODIUM: 140 mmol/L (ref 135–145)
TOTAL PROTEIN: 5.9 g/dL — AB (ref 6.5–8.1)

## 2018-02-04 LAB — GLUCOSE, CAPILLARY: Glucose-Capillary: 85 mg/dL (ref 70–99)

## 2018-02-04 LAB — PHOSPHORUS: PHOSPHORUS: 3.9 mg/dL (ref 2.5–4.6)

## 2018-02-04 LAB — MAGNESIUM: Magnesium: 1.7 mg/dL (ref 1.7–2.4)

## 2018-02-04 MED ORDER — ENOXAPARIN SODIUM 60 MG/0.6ML ~~LOC~~ SOLN
45.0000 mg | Freq: Two times a day (BID) | SUBCUTANEOUS | Status: DC
  Administered 2018-02-04: 45 mg via SUBCUTANEOUS
  Filled 2018-02-04 (×2): qty 0.45

## 2018-02-04 MED ORDER — BISACODYL 10 MG RE SUPP
10.0000 mg | Freq: Once | RECTAL | Status: AC
  Administered 2018-02-04: 10 mg via RECTAL
  Filled 2018-02-04: qty 1

## 2018-02-04 MED ORDER — SENNOSIDES-DOCUSATE SODIUM 8.6-50 MG PO TABS
1.0000 | ORAL_TABLET | Freq: Two times a day (BID) | ORAL | Status: DC
  Administered 2018-02-04 – 2018-02-07 (×6): 1 via ORAL
  Filled 2018-02-04 (×6): qty 1

## 2018-02-04 MED ORDER — POLYETHYLENE GLYCOL 3350 17 G PO PACK
17.0000 g | PACK | Freq: Two times a day (BID) | ORAL | Status: DC
  Administered 2018-02-05 – 2018-02-07 (×5): 17 g via ORAL
  Filled 2018-02-04 (×6): qty 1

## 2018-02-04 MED ORDER — BISACODYL 10 MG RE SUPP: 10.0000 mg | Freq: Every day | RECTAL | Status: DC | PRN

## 2018-02-04 NOTE — Plan of Care (Signed)
  Problem: Clinical Measurements: Goal: Ability to maintain clinical measurements within normal limits will improve Outcome: Progressing Goal: Diagnostic test results will improve Outcome: Progressing   Problem: Activity: Goal: Risk for activity intolerance will decrease Outcome: Progressing   Problem: Nutrition: Goal: Adequate nutrition will be maintained Outcome: Progressing   Problem: Elimination: Goal: Will not experience complications related to bowel motility Outcome: Progressing   Problem: Pain Managment: Goal: General experience of comfort will improve Outcome: Progressing   

## 2018-02-04 NOTE — Progress Notes (Signed)
PROGRESS NOTE    Teresa Bates  ZOX:[id number] DOB: 06-19-1929 DOA: 01/31/2018 PCP: Thurman Coyer, MD \  Brief Narrative:  HPI per Dr. Koren Bound on 01/31/18 Teresa Bates is a 84 y.o. Spanish-speaking female with medical history significant for A. fib on Eliquis, PPM placement in 2015 [facility]. Jude's [id number], serial #[id number]), Parkinson's disease, hypertension, depression/anxiety and chronic back pain who presents to the ED with 2 months of progressively worsening lower back pain that is stabbing in nature with radiation to the bilateral thighs and is limiting activities due to pain. History mostly obtained from patient's son, Sherilyn Cooter. No recent falls or trauma.  No focal weakness, numbness, tingling in either lower extremity.  No loss of bowel or bladder continence, no urinary retention.  No visual disturbance or change in speech.  There is no known history of malignancy.  Patient is currently visiting her son from [place], [place], and he was concerned about her worsening pain that was uncontrolled with home Norco.  ED Course: In the ED, patient was in severe pain requiring multiple doses of IV narcotics.  He was afebrile, mildly tachycardic, hypertensive and saturating comfortably on room air.  Labs showed no abnormality.  Urinalysis was unremarkable.  X-ray of the T and L-spine showed severe T9 fracture and L1-L5 compression fractures of unknown acuity.  Orthopedic surgery was consulted and recommended admission for pain control and likely nonoperative management of her lumbar compression fractures.   **Patient unfortunately unable to go under MRI due to incompatibility with pacemaker.  Bone scan has been done.  Orthopedic surgery to evaluate and review bone scan and make further recommendations.  PT/OT to be done after orthopedic recommendations.  Plan is to discharge to skilled nursing facility eventually.  Hospitalization has been complicated by constipation.  Assessment & Plan:     Active Problems:   Lumbar compression fracture (HCC)  Thoracic and Lumbar Compression Fractures without Cauda Equina -Orthopedic surgery consulted and recommending TLSO brace and is likely can be treated definitively with this however will need further imaging studies; patient unable to tolerate TLSO brace while laying flat in bed and states that "it slides up". -MRI of the thoracic and lumbar spine is unable to be done secondary to incompatibility of pacemaker p.m. 2240 serial #[id number] with MRI machines -Ordered a whole-body bone scan to further evaluate Acuity of Fractures since MRI cannot be done; Bone scan done and showed multiple healing bilateral rib fractures, healing T7, T9, T11, and L2 vertebral compression fractures, and degenerative change or healing focal fracture in the left medial tibial plateau -Pain control with scheduled Tylenol, home Lyrica, as needed Oxycodone 5 mg q3hprn, low-dose 0.5 mg IV Dilaudid for breakthrough -Physical therapy evaluation following clearance from Orthopedic Surgery still yet to be done and will likely not be done until tomorrow when Dr. Yevette Edwards is back -Started calcium and vitamin D supplementation -Bowel regimen being adjusted -Social worker involved for assistance with placement offered patient and her family bed offers but will need insurance authorization  A. Fib -Continue home Toprol XL 25 mg po Daily  -Switched Eliquis to Lovenox 1 mg/kg initially while awaiting formal decision on operative versus non-operative management -We will have Orthopedic Surgery follow-up on bone scan ordered as above and resume Home Eliquis if non-operative treatment is recommended based on the imaging studies performed.   Constipation -Patient states that she has not since being hospitalized -Have adjusted bowel regimen to the patient on senna docusate 1 tab p.o. twice daily, scheduled MiraLAX  17 g p.o. twice daily, as well as PRN bisacodyl suppository 10 mg and  give one-time dose of bisacodyl suppository today  HTN -C/w Metoprolol Succinate 25 mg po Daily and Lisiniopril 2.5 mg p.o. Nightly -Pressure was elevated earlier likely secondary to pain and was 178/90 however is now improved to 126/77  Anxiety and Depression -Continue home Citalopram 20 mg po qHS  Hypokalemia -Patient's K+ was 3.4 and improved to 3.8 -Continue to Monitor and Replete as Necessary -Repeat CMP in AM   Parkinson's  -Continue with Lyrica 75 mg p.o. twice daily along with Pramipexole 0.25 mg p.o. 3 times daily  GERD -Continue with Omeprazole substitution with Protonix 40 mg p.o. Daily  Normocytic Anemia -Patient's hemoglobin/hematocrit went from 12.4/38.8 and is now 12.0/37.4 -Checked Anemia Panel and showed an iron level 45, U IBC 163, TIBC of 208, saturation ratio is 22%, ferritin level of 371, folate level of 5.3, vitamin B12 level 414 -Continue monitor for signs and symptoms of bleeding -Repeat CBC in the a.m.  Hyperglycemia in the setting of Impaired Fasting Glucose/PreDiabetes -Patient blood sugar on daily BMPs has ranged from 93-106 -Checked hemoglobin A1c and was 5.8 -If blood sugars continually remain elevated will place on sensitive NovoLog sliding scale AC  DVT prophylaxis: Anticoagulated with 1 mg/kg of enoxaparin every 12h currently until further recommendations made by Orthopedic Surgery Code Status: FULL CODE Family Communication: No family present at bedside  Disposition Plan: Pending further work-up and bone scan review by Dr. Merlyn Albert.  PT and OT will need to evaluate but likely anticipate discharge to skilled nursing facility in the next 24 to 48 hours if nonoperative management is required  Consultants:   Orthopedic Surgery Dr. Yevette Edwards  Procedures:   None  Antimicrobials:  Anti-infectives (From admission, onward)   None     Subjective: Seen and examined at bedside and states that she was in more pain today especially when trying to  move to the bedside commode.  States that she has not had a bowel movement since coming in.  No chest pain, lightheadedness or dizziness.  Feels better when she does not move.  No other concerns or complaints at this time.  Objective: Vitals:   02/04/18 0748 02/04/18 0822 02/04/18 0835 02/04/18 1328  BP: (!) 158/84   126/77  Pulse: 75   86  Resp: 16   16  Temp:    98 F (36.7 C)  TempSrc:    Oral  SpO2: 95%   91%  Weight:  50.8 kg 43.1 kg   Height:        Intake/Output Summary (Last 24 hours) at 02/04/2018 1454 Last data filed at 02/04/2018 1021 Gross per 24 hour  Intake 600 ml  Output 1450 ml  Net -850 ml   Filed Weights   02/01/18 0500 02/04/18 0822 02/04/18 0835  Weight: 46.1 kg 50.8 kg 43.1 kg   Examination: Physical Exam:  Constitutional: Thin, frail, elderly female who is currently no acute distress appears calm but looks very uncomfortable laying in bed Eyes: Sclera anicteric.  Lids and conjunctive are normal ENMT: External ears and nose appear normal Neck: Appears supple with no JVD Respiratory: Diminished to auscultation bilaterally no appreciable wheezing, rales, rhonchi Cardiovascular: Regular rate and rhythm.  Has a slight murmur.  No lower extremity edema noted  Abdomen: Soft, nontender, nondistended.  Bowel sounds present in 4 quadrants GU: Deferred Musculoskeletal: No contractures or cyanosis.  No joint deformities Skin: Skin is warm and dry no appreciable rashes  or lesions on limited skin evaluation Neurologic: Cranial nerves II through XII grossly intact no appreciable focal deficits Psychiatric: Normal pleasant mood and affect.  Intact judgment insight.  Patient is awake, alert and oriented  Data Reviewed: I have personally reviewed following labs and imaging studies  CBC: Recent Labs  Lab 01/31/18 1148 02/01/18 0454 02/02/18 0452 02/03/18 0344 02/04/18 0359  WBC 9.3 7.7 6.0 6.2 5.0  NEUTROABS 8.0*  --  3.8 3.7 2.8  HGB 12.4 11.6* 11.8* 12.2 12.0   HCT 38.8 35.8* 36.7 37.7 37.4  MCV 99.5 100.0 100.0 98.7 98.9  PLT 322 289 299 317 310   Basic Metabolic Panel: Recent Labs  Lab 01/31/18 1148 01/31/18 2215 02/01/18 0454 02/02/18 0452 02/03/18 0344 02/04/18 0359  NA 142  --  136 137 136 140  K 3.8  --  3.4* 3.7 4.0 3.8  CL 103  --  101 102 103 106  CO2 28  --  26 25 24 27   GLUCOSE 93  --  106* 96 98 98  BUN 17  --  15 21 20 17   CREATININE 0.80  --  0.67 0.66 0.76 0.72  CALCIUM 9.7  --  9.1 9.0 9.1 9.4  MG  --  1.7  --  1.7 1.7 1.7  PHOS  --  3.3  --  3.3 3.3 3.9   GFR: Estimated Creatinine Clearance: 35 mL/min (by C-G formula based on SCr of 0.72 mg/dL). Liver Function Tests: Recent Labs  Lab 01/31/18 2215 02/02/18 0452 02/03/18 0344 02/04/18 0359  AST 27 23 27 29   ALT 16 13 13 16   ALKPHOS 159* 121 126 125  BILITOT 0.9 0.7 0.8 0.8  PROT 6.8 5.6* 5.7* 5.9*  ALBUMIN 3.3* 2.6* 2.7* 2.9*   No results for input(s): LIPASE, AMYLASE in the last 168 hours. No results for input(s): AMMONIA in the last 168 hours. Coagulation Profile: Recent Labs  Lab 01/31/18 2215  INR 1.19   Cardiac Enzymes: No results for input(s): CKTOTAL, CKMB, CKMBINDEX, TROPONINI in the last 168 hours. BNP (last 3 results) No results for input(s): PROBNP in the last 8760 hours. HbA1C: Recent Labs    02/02/18 0452  HGBA1C 5.8*   CBG: Recent Labs  Lab 02/01/18 0729 02/02/18 0729 02/03/18 0757 02/04/18 0742  GLUCAP 102* 89 95 85   Lipid Profile: No results for input(s): CHOL, HDL, LDLCALC, TRIG, CHOLHDL, LDLDIRECT in the last 72 hours. Thyroid Function Tests: No results for input(s): TSH, T4TOTAL, FREET4, T3FREE, THYROIDAB in the last 72 hours. Anemia Panel: Recent Labs    02/02/18 0452  VITAMINB12 414  FOLATE 5.3*  FERRITIN 371*  TIBC 208*  IRON 45  RETICCTPCT 1.2   Sepsis Labs: No results for input(s): PROCALCITON, LATICACIDVEN in the last 168 hours.  No results found for this or any previous visit (from the past 240  hour(s)).   Radiology Studies: Nm Bone Scan Whole Body  Result Date: 02/02/2018 CLINICAL DATA:  Mid and bilateral lower back pain for the past month. The patient fell in January 2019 and again in August 2019. History of severe arthritis and osteoporosis. EXAM: NUCLEAR MEDICINE WHOLE BODY BONE SCAN TECHNIQUE: Whole body anterior and posterior images were obtained approximately 3 hours after intravenous injection of radiopharmaceutical. RADIOPHARMACEUTICALS:  21.3 mCi Technetium-41m MDP IV COMPARISON:  Lumbar spine radiographs dated 01/31/2018. FINDINGS: Focal areas of increased tracer uptake in multiple bilateral ribs, aligned in a linear fashion from 1 rib to the necks. Increased tracer uptake in the  T7, T9, T11 and L2 vertebral bodies with flattening of the vertebral bodies. Focally increased tracer activity in the medial left knee at the medial tibial plateau. Normal renal and bladder activity. IMPRESSION: 1. Multiple healing bilateral rib fractures. 2. Healing T7, T9, T11 and L2 vertebral compression fractures. 3. Degenerative change or healing focal fracture in the left medial tibial plateau. Electronically Signed   By: Beckie Salts M.D.   On: 02/02/2018 17:22   Scheduled Meds: . acetaminophen  500 mg Oral Q6H  . calcium-vitamin D  1 tablet Oral Q breakfast  . citalopram  20 mg Oral QHS  . enoxaparin (LOVENOX) injection  45 mg Subcutaneous Q12H  . lisinopril  2.5 mg Oral QHS  . metoprolol succinate  25 mg Oral Daily  . pantoprazole  40 mg Oral Daily  . polyethylene glycol  17 g Oral BID  . pramipexole  0.25 mg Oral TID  . pregabalin  75 mg Oral BID  . senna-docusate  1 tablet Oral BID   Continuous Infusions:   LOS: 4 days   Merlene Laughter, DO [facility] PAGER is on AMION  If 7PM-7AM, please contact night-coverage [email or web address] Password [email or web address] 02/04/2018, 2:54 PM

## 2018-02-05 DIAGNOSIS — I10 Essential (primary) hypertension: Secondary | ICD-10-CM

## 2018-02-05 DIAGNOSIS — G20 Parkinson's disease: Secondary | ICD-10-CM

## 2018-02-05 DIAGNOSIS — F32A Depression, unspecified: Secondary | ICD-10-CM

## 2018-02-05 DIAGNOSIS — F329 Major depressive disorder, single episode, unspecified: Secondary | ICD-10-CM

## 2018-02-05 HISTORY — DX: Essential (primary) hypertension: I10

## 2018-02-05 HISTORY — DX: Hypomagnesemia: E83.42

## 2018-02-05 LAB — MAGNESIUM: MAGNESIUM: 1.6 mg/dL — AB (ref 1.7–2.4)

## 2018-02-05 LAB — CBC WITH DIFFERENTIAL/PLATELET
BASOS ABS: 0 10*3/uL (ref 0.0–0.1)
Basophils Relative: 1 %
Eosinophils Absolute: 0.2 10*3/uL (ref 0.0–0.7)
Eosinophils Relative: 5 %
HCT: 36.8 % (ref 36.0–46.0)
Hemoglobin: 11.9 g/dL — ABNORMAL LOW (ref 12.0–15.0)
LYMPHS PCT: 27 %
Lymphs Abs: 1.4 10*3/uL (ref 0.7–4.0)
MCH: 32 pg (ref 26.0–34.0)
MCHC: 32.3 g/dL (ref 30.0–36.0)
MCV: 98.9 fL (ref 78.0–100.0)
MONOS PCT: 14 %
Monocytes Absolute: 0.7 10*3/uL (ref 0.1–1.0)
Neutro Abs: 2.8 10*3/uL (ref 1.7–7.7)
Neutrophils Relative %: 53 %
PLATELETS: 294 10*3/uL (ref 150–400)
RBC: 3.72 MIL/uL — ABNORMAL LOW (ref 3.87–5.11)
RDW: 14.1 % (ref 11.5–15.5)
WBC: 5.1 10*3/uL (ref 4.0–10.5)

## 2018-02-05 LAB — COMPREHENSIVE METABOLIC PANEL
ALT: 19 U/L (ref 0–44)
ANION GAP: 8 (ref 5–15)
AST: 31 U/L (ref 15–41)
Albumin: 2.7 g/dL — ABNORMAL LOW (ref 3.5–5.0)
Alkaline Phosphatase: 116 U/L (ref 38–126)
BUN: 15 mg/dL (ref 8–23)
CHLORIDE: 103 mmol/L (ref 98–111)
CO2: 27 mmol/L (ref 22–32)
Calcium: 9.1 mg/dL (ref 8.9–10.3)
Creatinine, Ser: 0.71 mg/dL (ref 0.44–1.00)
Glucose, Bld: 97 mg/dL (ref 70–99)
POTASSIUM: 4 mmol/L (ref 3.5–5.1)
Sodium: 138 mmol/L (ref 135–145)
TOTAL PROTEIN: 5.4 g/dL — AB (ref 6.5–8.1)
Total Bilirubin: 0.6 mg/dL (ref 0.3–1.2)

## 2018-02-05 LAB — PHOSPHORUS: PHOSPHORUS: 3.5 mg/dL (ref 2.5–4.6)

## 2018-02-05 LAB — GLUCOSE, CAPILLARY: Glucose-Capillary: 90 mg/dL (ref 70–99)

## 2018-02-05 MED ORDER — APIXABAN 2.5 MG PO TABS
2.5000 mg | ORAL_TABLET | Freq: Two times a day (BID) | ORAL | Status: DC
  Administered 2018-02-05 – 2018-02-07 (×5): 2.5 mg via ORAL
  Filled 2018-02-05 (×5): qty 1

## 2018-02-05 MED ORDER — MAGNESIUM SULFATE 2 GM/50ML IV SOLN
2.0000 g | Freq: Once | INTRAVENOUS | Status: AC
  Administered 2018-02-05: 2 g via INTRAVENOUS
  Filled 2018-02-05: qty 50

## 2018-02-05 NOTE — Progress Notes (Signed)
ANTICOAGULATION CONSULT NOTE - Initial Consult  Pharmacy Consult for eliquis Indication: atrial fibrillation  Allergies  Allergen Reactions  . Carbamazepine Rash    Patient Measurements: Height: 5' (152.4 cm) Weight: 95 lb (43.1 kg)(re-zero'ed the [employer] Low Bed to get measurement) IBW/kg (Calculated) : 45.5   Vital Signs: Temp: 98.1 F (36.7 C) (09/30 0603) Temp Source: Oral (09/30 0603) BP: 139/72 (09/30 0603) Pulse Rate: 76 (09/30 0603)  Labs: Recent Labs    02/03/18 0344 02/04/18 0359 02/05/18 0406  HGB 12.2 12.0 11.9*  HCT 37.7 37.4 36.8  PLT 317 310 294  CREATININE 0.76 0.72 0.71    Estimated Creatinine Clearance: 35 mL/min (by C-G formula based on SCr of 0.71 mg/dL).   Medical History: Past Medical History:  Diagnosis Date  . A-fib (HCC)   . Anxiety   . Arthritis   . Depression   . Hypertension   . Parkinson's disease [facility])      Assessment: 84 yo female with med hx significant for Afib on eliquis present to ED with 2 months of progressively worsening lower back pain.  Pt was put on enoxaparin pending ortho recs.  No surgery at this time,  pharmacy consulted to resume home eliquis.   02/05/2018 Scr 0.71, CrCl ~ 41mls/min Wt 43.1 Kg  Goal of Therapy:  eliquis per indication and renal function  Plan:  Resume home dose of eliquis 2.5mg  po twice daily Stop enoxaparin Will sign off and follow peripherally  Arley Phenix RPh 02/05/2018, 9:16 AM Pager [phone]

## 2018-02-05 NOTE — Evaluation (Signed)
Physical Therapy Evaluation Patient Details Name: Teresa Bates MRN: [id number] DOB: [birth year] Today's Date: 02/05/2018   History of Present Illness  Teresa Bates is a 84 y.o. Spanish-speaking female with medical history significant for A. fib , PPM placement , Parkinson's disease, hypertension, depression/anxiety and chronic back pain, visiting from [place]. Presents to the ED 01/31/18  progressively worsening lower back pain . Compression fractures at T10, L1-5. Paer orhto, wear TLSO when OOB.  Clinical Impression  The patient actually mobilized better than anticipated and ambulated 30' x 2. Son  Indicates plans for rehab setting at DC. Patient will benefit from short term rehab. Pt admitted with above diagnosis. Pt currently with functional limitations due to the deficits listed below (see PT Problem List). Pt will benefit from skilled PT to increase their independence and safety with mobility to allow discharge to the venue listed below.       Follow Up Recommendations SNF;Supervision/Assistance - 24 hour    Equipment Recommendations  Rolling walker with 5" wheels(youth)    Recommendations for Other Services       Precautions / Restrictions Precautions Precautions: Fall Precaution Comments: wear TLSO when OOB, today placed when sitting as patient sat herself up at Bedside  Required Braces or Orthoses: Spinal Brace Spinal Brace: Applied in sitting position , position not indicated by MD     Mobility  Bed Mobility Overal bed mobility: Needs Assistance Bed Mobility: Rolling;Sidelying to Sit Rolling: Min assist Sidelying to sit: Min assist       General bed mobility comments: multimodal cues for  back recautions, rolling. Assisted with trunk, cues to scoot to bed edge with min assist. Donned brace with max assist.  Transfers Overall transfer level: Needs assistance Equipment used: Rolling walker (2 wheeled) Transfers: Sit to/from Stand Sit to Stand: Mod assist          General transfer comment: steady lifting assist to rise from bed and recliner, steady asssist to balance upon standing.  Cues to reach to recliner which patient did not perform  Ambulation/Gait Ambulation/Gait assistance: Mod assist;+2 safety/equipment Gait Distance (Feet): 30 Feet(x 2) Assistive device: Rolling walker (2 wheeled) Gait Pattern/deviations: Step-through pattern;Step-to pattern;Trunk flexed;Narrow base of support;Decreased weight shift to left     General Gait Details: Left knee tending to be flexed , cues for safety and use of RW.  Stairs            Wheelchair Mobility    Modified Rankin (Stroke Patients Only)       Balance Overall balance assessment: Needs assistance Sitting-balance support: Feet supported;Bilateral upper extremity supported Sitting balance-Leahy Scale: Fair Sitting balance - Comments: supports with UE's   Standing balance support: During functional activity;Bilateral upper extremity supported Standing balance-Leahy Scale: Poor Standing balance comment: relies on UE's                             Pertinent Vitals/Pain Pain Assessment: 0-10 Pain Score: 7  Pain Location: back Pain Descriptors / Indicators: Aching;Discomfort Pain Intervention(s): Limited activity within patient's tolerance;Monitored during session;Premedicated before session    Home Living Family/patient expects to be discharged to:: Private residence Living Arrangements: Children Available Help at Discharge: Family;Available PRN/intermittently           Home Equipment: Gilmer Mor - single point Additional Comments: visiting son. Plans SNF per son    Prior Function Level of Independence: Needs assistance   Gait / Transfers Assistance Needed: amb. w/ SPC,  Hand Dominance   Dominant Hand: Right    Extremity/Trunk Assessment   Upper Extremity Assessment Upper Extremity Assessment: Defer to OT evaluation    Lower Extremity  Assessment Lower Extremity Assessment: Generalized weakness    Cervical / Trunk Assessment Cervical / Trunk Assessment: Kyphotic Cervical / Trunk Exceptions: TLSO  Communication   Communication: Prefers language other than English(Spanish-)  Cognition Arousal/Alertness: Awake/alert Behavior During Therapy: WFL for tasks assessed/performed;Anxious Overall Cognitive Status: Within Functional Limits for tasks assessed                                 General Comments: speaks some [place].      General Comments      Exercises     Assessment/Plan    PT Assessment Patient needs continued PT services  PT Problem List Decreased strength;Decreased activity tolerance;Decreased knowledge of use of DME;Decreased balance;Decreased safety awareness;Decreased mobility;Decreased knowledge of precautions;Pain       PT Treatment Interventions DME instruction;Gait training;Functional mobility training;Therapeutic activities;Patient/family education    PT Goals (Current goals can be found in the Care Plan section)  Acute Rehab PT Goals Patient Stated Goal: per son, to rehab PT Goal Formulation: With patient/family Time For Goal Achievement: 02/12/18 Potential to Achieve Goals: Fair    Frequency Min 3X/week   Barriers to discharge Decreased caregiver support      Co-evaluation               AM-PAC PT "6 Clicks" Daily Activity  Outcome Measure Difficulty turning over in bed (including adjusting bedclothes, sheets and blankets)?: Unable Difficulty moving from lying on back to sitting on the side of the bed? : Unable Difficulty sitting down on and standing up from a chair with arms (e.g., wheelchair, bedside commode, etc,.)?: Unable Help needed moving to and from a bed to chair (including a wheelchair)?: Total Help needed walking in hospital room?: Total Help needed climbing 3-5 steps with a railing? : Total 6 Click Score: 6    End of Session Equipment Utilized  During Treatment: Gait belt;Back brace Activity Tolerance: Patient tolerated treatment well Patient left: in chair;with call bell/phone within reach;with chair alarm set;with family/visitor present Nurse Communication: Mobility status PT Visit Diagnosis: Unsteadiness on feet (R26.81);Pain;Other abnormalities of gait and mobility (R26.89)    Time: 1610-9604 PT Time Calculation (min) (ACUTE ONLY): 29 min   Charges:   PT Evaluation $PT Eval Low Complexity: 1 Low PT Treatments $Gait Training: 8-22 mins        Blanchard Kelch PT Acute Rehabilitation Services Pager [phone] Office [phone]   Rada Hay 02/05/2018, 4:46 PM

## 2018-02-05 NOTE — Plan of Care (Signed)
  Problem: Clinical Measurements: Goal: Ability to maintain clinical measurements within normal limits will improve Outcome: Progressing Goal: Diagnostic test results will improve Outcome: Progressing   Problem: Activity: Goal: Risk for activity intolerance will decrease Outcome: Progressing   Problem: Nutrition: Goal: Adequate nutrition will be maintained Outcome: Progressing   Problem: Elimination: Goal: Will not experience complications related to bowel motility Outcome: Progressing   Problem: Pain Managment: Goal: General experience of comfort will improve Outcome: Progressing   

## 2018-02-05 NOTE — Progress Notes (Signed)
Bone scan reviewed, notable for multiple compression fractures as well as multiple rib fractures. Current recommendation continues to be treatment in TLSO brace. Fractures will heal in time, but this may take 2-3 months, but pain is expected to improve gradually as fractures heal. Brace is not needed when patient is laying flat. Patient should keep her scheduled office follow-up with me, and can be discharged once pain is adequately controlled with PO pain medications.

## 2018-02-05 NOTE — Progress Notes (Signed)
PROGRESS NOTE    Teresa Bates  NWG:[id number] DOB: 05-02-1930 DOA: 01/31/2018 PCP: Thurman Coyer, MD \  Brief Narrative:  HPI per Dr. Koren Bound on 01/31/18 Teresa Bates is a 84 y.o. Spanish-speaking female with medical history significant for A. fib on Eliquis, PPM placement in 2015 [facility]. Jude's [id number], serial #[id number]), Parkinson's disease, hypertension, depression/anxiety and chronic back pain who presents to the ED with 2 months of progressively worsening lower back pain that is stabbing in nature with radiation to the bilateral thighs and is limiting activities due to pain. History mostly obtained from patient's son, Sherilyn Cooter. No recent falls or trauma.  No focal weakness, numbness, tingling in either lower extremity.  No loss of bowel or bladder continence, no urinary retention.  No visual disturbance or change in speech.  There is no known history of malignancy.  Patient is currently visiting her son from [place], [place], and he was concerned about her worsening pain that was uncontrolled with home Norco.  ED Course: In the ED, patient was in severe pain requiring multiple doses of IV narcotics.  He was afebrile, mildly tachycardic, hypertensive and saturating comfortably on room air.  Labs showed no abnormality.  Urinalysis was unremarkable.  X-ray of the T and L-spine showed severe T9 fracture and L1-L5 compression fractures of unknown acuity.  Orthopedic surgery was consulted and recommended admission for pain control and likely nonoperative management of her lumbar compression fractures.   **Patient unfortunately unable to go under MRI due to incompatibility with pacemaker.  Bone scan has been done.  Orthopedic surgery to evaluated and still feel that the best course of action for the patient is continue treatment TLSO brace as a fractures were healing time but may take 2 to 3 months.  Per Dr. Yevette Edwards pain is expected to improve gradually as fractures he will in the brace is not  needed while the patient is laying flat.  PT/OT ordered after I discussed the case with Dr. Yevette Edwards who recommends the patient have no aggressive physical therapy done but work with PT and OT to work on gait training and transfers.  Plan is to discharge to skilled nursing facility eventually and family in the process of picking skilled nursing facility.  Hospitalization has been complicated by constipation but this is improving.  Assessment & Plan:   Active Problems:   Lumbar compression fracture (HCC)  Thoracic and Lumbar Compression Fractures without Cauda Equina -Orthopedic surgery consulted and recommending TLSO brace and is likely can be treated definitively with this however will need further imaging studies; patient unable to tolerate TLSO brace while laying flat in bed and states that "it slides up". -MRI of the thoracic and lumbar spine is unable to be done secondary to incompatibility of pacemaker p.m. 2240 serial #[id number] with MRI machines -Ordered a whole-body bone scan to further evaluate Acuity of Fractures since MRI cannot be done; Bone scan done and showed multiple healing bilateral rib fractures, healing T7, T9, T11, and L2 vertebral compression fractures, and degenerative change or healing focal fracture in the left medial tibial plateau -Pain control with scheduled Tylenol, home Lyrica, as needed Oxycodone 5 mg q3hprn, low-dose 0.5 mg IV Dilaudid for breakthrough -Dr. Yevette Edwards reviewed the bone scan which was notable for multiple compression fractures as well as rib fractures and continues to recommend treatment in the TLSO brace.  Family was concerned and wanted to discuss with Dr. Yevette Edwards about kyphoplasty/vertebroplasty and I will leave that discussion up to Dr. Yevette Edwards and the  family -PT and OT ordered and Dr. Yevette Edwards recommends no aggressive physical therapy that would cause strenuous activity on the back but does recommend therapy for gait training and transfers -Started  calcium and vitamin D supplementation -Bowel regimen being adjusted as below -Child psychotherapist involved for assistance with placement offered patient and her family bed offers but will need insurance authorization and to pick a facility  A. Fib -Continue home Toprol XL 25 mg po Daily  -Switched Eliquis to Lovenox 1 mg/kg initially while awaiting formal decision on operative versus non-operative management but have now switched to Eliquis now that Dr. Yevette Edwards is recommending conservative measures with just a TLSO brace  Constipation -Patient states that she has not since being hospitalized -Have adjusted bowel regimen to the patient on senna docusate 1 tab p.o. twice daily, scheduled MiraLAX 17 g p.o. twice daily, as well as PRN bisacodyl suppository 10 mg and give one-time dose of bisacodyl suppository today -If not improved will consider enema  HTN -C/w Metoprolol Succinate 25 mg po Daily and Lisiniopril 2.5 mg p.o. Nightly -Pressure was elevated earlier likely secondary to pain and is now 152/93  Anxiety and Depression -Continue home Citalopram 20 mg po qHS  Hypokalemia -Patient's K+ was 3.4 and improved to 4.0 -Continue to Monitor and Replete as Necessary -Repeat CMP in AM   Parkinson's  -Continue with Lyrica 75 mg p.o. twice daily along with Pramipexole 0.25 mg p.o. 3 times daily  GERD -Continue with Omeprazole substitution with Protonix 40 mg p.o. Daily  Normocytic Anemia -Patient's hemoglobin/hematocrit went from 12.4/38.8 and is now 11.9/36.8 -Checked Anemia Panel and showed an iron level 45, U IBC 163, TIBC of 208, saturation ratio is 22%, ferritin level of 371, folate level of 5.3, vitamin B12 level 414 -Continue monitor for signs and symptoms of bleeding -Repeat CBC in the a.m.  Hyperglycemia in the setting of Impaired Fasting Glucose/PreDiabetes -Patient blood sugar on daily BMPs has ranged from 85-102 -Checked hemoglobin A1c and was 5.8 -If blood sugars  continually remain elevated will place on sensitive NovoLog sliding scale AC  Hypomagnesemia -Patient magnesium level this morning was 1.6 -Replete with IV mag sulfate 2 g -Continue monitor replete as necessary -Repeat Magnesium level in the a.m.  DVT prophylaxis: Anticoagulated with home apixaban Code Status: FULL CODE Family Communication: Discussed with daughter at bedside Disposition Plan: Pending further work-up and bone scan review by Dr. Merlyn Albert.  PT and OT to evaluate and will need SNF placement with social work assistance  Consultants:   Orthopedic Surgery Dr. Yevette Edwards  Procedures:   None  Antimicrobials:  Anti-infectives (From admission, onward)   None     Subjective: Seen and examined at bedside and states that pain is better controlled however still has significant pain when trying to move.  Daughter wanted to discuss about kyphoplasty/vertebroplasty but I will leave this discussion to Dr. Yevette Edwards as he was recommended conservative measures with just a TLSO brace.  No nausea, vomiting, lightheadedness or dizziness.  No other concerns or complaints at this time and family yet to pick a skilled nursing facility for Discharge.   Objective: Vitals:   02/04/18 2247 02/05/18 0603 02/05/18 0950 02/05/18 1317  BP: 140/89 139/72  (!) 152/93  Pulse: 75 76  85  Resp: 15 15  14   Temp: 98.1 F (36.7 C) 98.1 F (36.7 C)  98.4 F (36.9 C)  TempSrc: Oral Oral  Oral  SpO2: 95% 95%  95%  Weight:   47.6 kg  Height:        Intake/Output Summary (Last 24 hours) at 02/05/2018 1517 Last data filed at 02/05/2018 1400 Gross per 24 hour  Intake 740 ml  Output 1300 ml  Net -560 ml   Filed Weights   02/04/18 0822 02/04/18 0835 02/05/18 0950  Weight: 50.8 kg 43.1 kg 47.6 kg   Examination: Physical Exam:  Constitutional: Thin, frail, elderly female is currently in no acute distress appears calm but does still look very uncomfortable Eyes: Sclera anicteric.  Lids and  conjunctive are normal ENMT: External ears and nose appear normal Neck: Appears supple with no JVD Respiratory: Diminished to auscultation bilaterally with no appreciable wheezing, rales, rhonchi Cardiovascular: Regular rate and rhythm.  Has a slight murmur.  No lower extremity edema noted Abdomen: Soft, nontender, nondistended.  Bowel sounds present all 4 quadrants GU: Deferred Musculoskeletal: No contractures or cyanosis.  No joint deformities noted Skin: Is warm and dry no appreciable rashes or lesions limited skin evaluation Neurologic: Cranial nerves II through XII grossly intact no appreciable focal deficits Psychiatric: Normal and pleasant mood and affect.  Intact judgment and insight.  Data Reviewed: I have personally reviewed following labs and imaging studies  CBC: Recent Labs  Lab 01/31/18 1148 02/01/18 0454 02/02/18 0452 02/03/18 0344 02/04/18 0359 02/05/18 0406  WBC 9.3 7.7 6.0 6.2 5.0 5.1  NEUTROABS 8.0*  --  3.8 3.7 2.8 2.8  HGB 12.4 11.6* 11.8* 12.2 12.0 11.9*  HCT 38.8 35.8* 36.7 37.7 37.4 36.8  MCV 99.5 100.0 100.0 98.7 98.9 98.9  PLT 322 289 299 317 310 294   Basic Metabolic Panel: Recent Labs  Lab 01/31/18 2215 02/01/18 0454 02/02/18 0452 02/03/18 0344 02/04/18 0359 02/05/18 0406  NA  --  136 137 136 140 138  K  --  3.4* 3.7 4.0 3.8 4.0  CL  --  101 102 103 106 103  CO2  --  26 25 24 27 27   GLUCOSE  --  106* 96 98 98 97  BUN  --  15 21 20 17 15   CREATININE  --  0.67 0.66 0.76 0.72 0.71  CALCIUM  --  9.1 9.0 9.1 9.4 9.1  MG 1.7  --  1.7 1.7 1.7 1.6*  PHOS 3.3  --  3.3 3.3 3.9 3.5   GFR: Estimated Creatinine Clearance: 36.9 mL/min (by C-G formula based on SCr of 0.71 mg/dL). Liver Function Tests: Recent Labs  Lab 01/31/18 2215 02/02/18 0452 02/03/18 0344 02/04/18 0359 02/05/18 0406  AST 27 23 27 29 31   ALT 16 13 13 16 19   ALKPHOS 159* 121 126 125 116  BILITOT 0.9 0.7 0.8 0.8 0.6  PROT 6.8 5.6* 5.7* 5.9* 5.4*  ALBUMIN 3.3* 2.6* 2.7*  2.9* 2.7*   No results for input(s): LIPASE, AMYLASE in the last 168 hours. No results for input(s): AMMONIA in the last 168 hours. Coagulation Profile: Recent Labs  Lab 01/31/18 2215  INR 1.19   Cardiac Enzymes: No results for input(s): CKTOTAL, CKMB, CKMBINDEX, TROPONINI in the last 168 hours. BNP (last 3 results) No results for input(s): PROBNP in the last 8760 hours. HbA1C: No results for input(s): HGBA1C in the last 72 hours. CBG: Recent Labs  Lab 02/01/18 0729 02/02/18 0729 02/03/18 0757 02/04/18 0742 02/05/18 0717  GLUCAP 102* 89 95 85 90   Lipid Profile: No results for input(s): CHOL, HDL, LDLCALC, TRIG, CHOLHDL, LDLDIRECT in the last 72 hours. Thyroid Function Tests: No results for input(s): TSH, T4TOTAL, FREET4, T3FREE, THYROIDAB  in the last 72 hours. Anemia Panel: No results for input(s): VITAMINB12, FOLATE, FERRITIN, TIBC, IRON, RETICCTPCT in the last 72 hours. Sepsis Labs: No results for input(s): PROCALCITON, LATICACIDVEN in the last 168 hours.  No results found for this or any previous visit (from the past 240 hour(s)).   Radiology Studies: No results found. Scheduled Meds: . acetaminophen  500 mg Oral Q6H  . apixaban  2.5 mg Oral BID  . calcium-vitamin D  1 tablet Oral Q breakfast  . citalopram  20 mg Oral QHS  . lisinopril  2.5 mg Oral QHS  . metoprolol succinate  25 mg Oral Daily  . pantoprazole  40 mg Oral Daily  . polyethylene glycol  17 g Oral BID  . pramipexole  0.25 mg Oral TID  . pregabalin  75 mg Oral BID  . senna-docusate  1 tablet Oral BID   Continuous Infusions:   LOS: 5 days   Merlene Laughter, DO [facility] PAGER is on AMION  If 7PM-7AM, please contact night-coverage [email or web address] Password [email or web address] 02/05/2018, 3:17 PM

## 2018-02-06 ENCOUNTER — Other Ambulatory Visit: Payer: Self-pay

## 2018-02-06 DIAGNOSIS — G20 Parkinson's disease: Secondary | ICD-10-CM

## 2018-02-06 LAB — CBC WITH DIFFERENTIAL/PLATELET
BASOS ABS: 0 10*3/uL (ref 0.0–0.1)
Basophils Relative: 1 %
Eosinophils Absolute: 0.2 10*3/uL (ref 0.0–0.7)
Eosinophils Relative: 3 %
HEMATOCRIT: 37.9 % (ref 36.0–46.0)
HEMOGLOBIN: 12.1 g/dL (ref 12.0–15.0)
LYMPHS PCT: 25 %
Lymphs Abs: 1.5 10*3/uL (ref 0.7–4.0)
MCH: 31.8 pg (ref 26.0–34.0)
MCHC: 31.9 g/dL (ref 30.0–36.0)
MCV: 99.5 fL (ref 78.0–100.0)
MONOS PCT: 10 %
Monocytes Absolute: 0.6 10*3/uL (ref 0.1–1.0)
NEUTROS ABS: 3.5 10*3/uL (ref 1.7–7.7)
Neutrophils Relative %: 61 %
Platelets: 317 10*3/uL (ref 150–400)
RBC: 3.81 MIL/uL — ABNORMAL LOW (ref 3.87–5.11)
RDW: 14.1 % (ref 11.5–15.5)
WBC: 5.9 10*3/uL (ref 4.0–10.5)

## 2018-02-06 LAB — COMPREHENSIVE METABOLIC PANEL
ALK PHOS: 122 U/L (ref 38–126)
ALT: 20 U/L (ref 0–44)
AST: 32 U/L (ref 15–41)
Albumin: 2.9 g/dL — ABNORMAL LOW (ref 3.5–5.0)
Anion gap: 8 (ref 5–15)
BILIRUBIN TOTAL: 0.6 mg/dL (ref 0.3–1.2)
BUN: 16 mg/dL (ref 8–23)
CALCIUM: 9.3 mg/dL (ref 8.9–10.3)
CO2: 26 mmol/L (ref 22–32)
CREATININE: 0.76 mg/dL (ref 0.44–1.00)
Chloride: 106 mmol/L (ref 98–111)
GFR calc Af Amer: >60 mL/min (ref >60)
Glucose, Bld: 102 mg/dL — ABNORMAL HIGH (ref 70–99)
Potassium: 3.9 mmol/L (ref 3.5–5.1)
Sodium: 140 mmol/L (ref 135–145)
TOTAL PROTEIN: 5.9 g/dL — AB (ref 6.5–8.1)

## 2018-02-06 LAB — MAGNESIUM: MAGNESIUM: 2.2 mg/dL (ref 1.7–2.4)

## 2018-02-06 LAB — GLUCOSE, CAPILLARY: Glucose-Capillary: 97 mg/dL (ref 70–99)

## 2018-02-06 LAB — PHOSPHORUS: Phosphorus: 3.5 mg/dL (ref 2.5–4.6)

## 2018-02-06 MED ORDER — CALCIUM CARBONATE-VITAMIN D 500-200 MG-UNIT PO TABS: 1.0000 | ORAL_TABLET | Freq: Every day | ORAL | 0 refills | Status: DC

## 2018-02-06 MED ORDER — POLYETHYLENE GLYCOL 3350 17 G PO PACK: 17.0000 g | PACK | Freq: Two times a day (BID) | ORAL | 0 refills | Status: DC

## 2018-02-06 MED ORDER — TRAMADOL HCL 50 MG PO TABS: 50.0000 mg | ORAL_TABLET | Freq: Three times a day (TID) | ORAL | 0 refills | Status: DC | PRN

## 2018-02-06 MED ORDER — OXYCODONE HCL 5 MG PO TABS: 5.0000 mg | ORAL_TABLET | ORAL | 0 refills | Status: DC | PRN

## 2018-02-06 MED ORDER — OXYCODONE HCL 5 MG PO TABS
10.0000 mg | ORAL_TABLET | ORAL | Status: DC | PRN
  Administered 2018-02-06 – 2018-02-07 (×2): 10 mg via ORAL
  Filled 2018-02-06 (×3): qty 2

## 2018-02-06 MED ORDER — BISACODYL 10 MG RE SUPP: 10.0000 mg | Freq: Every day | RECTAL | 0 refills | Status: DC | PRN

## 2018-02-06 MED ORDER — SENNOSIDES-DOCUSATE SODIUM 8.6-50 MG PO TABS: 1.0000 | ORAL_TABLET | Freq: Two times a day (BID) | ORAL | Status: DC

## 2018-02-06 MED ORDER — ACETAMINOPHEN 500 MG PO TABS: 500.0000 mg | ORAL_TABLET | Freq: Four times a day (QID) | ORAL | 0 refills | Status: DC

## 2018-02-06 MED ORDER — ONDANSETRON HCL 4 MG PO TABS: 4.0000 mg | ORAL_TABLET | Freq: Four times a day (QID) | ORAL | 0 refills | Status: DC | PRN

## 2018-02-06 NOTE — Progress Notes (Addendum)
Patient insurance changed from [facility] to [facility] as of 10/1.This morning HTA started insurance authorization process for SNF placement. Still pending at this time.   Patient and family agreeable to [facility].    Vivi Barrack, [place], MSW Clinical Social Worker  [phone] 02/06/2018  4:13 PM

## 2018-02-06 NOTE — Evaluation (Signed)
Occupational Therapy Evaluation Patient Details Name: Teresa Bates MRN: [id number] DOB: 09-30-1929 Today's Date: 02/06/2018    History of Present Illness Shayra Anton is a 84 y.o. Spanish-speaking female with medical history significant for A. fib , PPM placement , Parkinson's disease, hypertension, depression/anxiety and chronic back pain, visiting from [place]. Presents to the ED 01/31/18  progressively worsening lower back pain . Compression fractures at T10, L1-5. Paer orhto, wear TLSO when OOB.   Clinical Impression   Pt was admitted for the above.  She lives in [place] and is mod I at baseline. She needs up to max A for LB adls and min/mod A for sit to stand for transfers. Will follow in acute setting with min A level goals    Follow Up Recommendations  SNF;Supervision/Assistance - 24 hour    Equipment Recommendations  3 in 1 bedside commode    Recommendations for Other Services       Precautions / Restrictions Precautions Precautions: Fall Precaution Comments: wear TLSO when OOB, today placed when sitting as patient sat herself up at Bedside  Required Braces or Orthoses: Spinal Brace Spinal Brace: Applied in sitting position Restrictions Weight Bearing Restrictions: No      Mobility Bed Mobility     Rolling: Min assist Sidelying to sit: Min assist       General bed mobility comments: multimodal cues and use of bedrail  Transfers   Equipment used: Rolling walker (2 wheeled)   Sit to Stand: Min assist;Mod assist         General transfer comment: assist to rise and stabilize    Balance                                           ADL either performed or assessed with clinical judgement   ADL Overall ADL's : Needs assistance/impaired Eating/Feeding: Independent   Grooming: Set up   Upper Body Bathing: Set up   Lower Body Bathing: Moderate assistance;Sit to/from stand   Upper Body Dressing : Minimal assistance   Lower Body  Dressing: Maximal assistance;Sit to/from stand   Toilet Transfer: Minimal assistance;Moderate assistance;Stand-pivot;BSC;RW             General ADL Comments: Performed SPT to [facility], chair and completed ADL     Vision         Perception     Praxis      Pertinent Vitals/Pain Pain Assessment: Faces Faces Pain Scale: Hurts even more Pain Location: back Pain Descriptors / Indicators: Aching;Discomfort Pain Intervention(s): Monitored during session;Limited activity within patient's tolerance;Repositioned;Premedicated before session     Hand Dominance     Extremity/Trunk Assessment Upper Extremity Assessment Upper Extremity Assessment: Generalized weakness       Cervical / Trunk Assessment Cervical / Trunk Assessment: Kyphotic Cervical / Trunk Exceptions: TLSO   Communication Communication Communication: Prefers language other than English(spanish)   Cognition Arousal/Alertness: Awake/alert Behavior During Therapy: WFL for tasks assessed/performed;Anxious Overall Cognitive Status: Within Functional Limits for tasks assessed                                 General Comments: speaks some [place].   General Comments       Exercises     Shoulder Instructions      Home Living Family/patient expects to be discharged to:: Unsure Living Arrangements:  Children Available Help at Discharge: Family;Available PRN/intermittently                         Home Equipment: Gilmer Mor - single point   Additional Comments: visiting son. Plans SNF per son      Prior Functioning/Environment Level of Independence: Needs assistance  Gait / Transfers Assistance Needed: amb. w/ SPC,              OT Problem List: Decreased strength;Decreased activity tolerance;Impaired balance (sitting and/or standing);Pain;Decreased knowledge of use of DME or AE;Decreased safety awareness;Decreased knowledge of precautions      OT Treatment/Interventions: Self-care/ADL  training;DME and/or AE instruction;Therapeutic activities;Patient/family education;Balance training    OT Goals(Current goals can be found in the care plan section) Acute Rehab OT Goals Patient Stated Goal: less pain OT Goal Formulation: With patient Time For Goal Achievement: 02/20/18 Potential to Achieve Goals: Good ADL Goals Pt Will Transfer to Toilet: with min guard assist;bedside commode;stand pivot transfer Additional ADL Goal #1: pt will go from sit to stand with min guard assistance and maintain x 2 minutes at this level for adls Additional ADL Goal #2: pt will perform bed mobility at supervision level  OT Frequency: Min 2X/week   Barriers to D/C:            Co-evaluation              AM-PAC PT "6 Clicks" Daily Activity     Outcome Measure Help from another person eating meals?: None Help from another person taking care of personal grooming?: A Little Help from another person toileting, which includes using toliet, bedpan, or urinal?: A Lot Help from another person bathing (including washing, rinsing, drying)?: A Lot Help from another person to put on and taking off regular upper body clothing?: A Little Help from another person to put on and taking off regular lower body clothing?: A Lot 6 Click Score: 16   End of Session Nurse Communication: (pt up in chair)  Activity Tolerance: Patient tolerated treatment well Patient left: in chair;with call bell/phone within reach;with chair alarm set  OT Visit Diagnosis: Muscle weakness (generalized) (M62.81);Unsteadiness on feet (R26.81)                Time: 1610-9604 OT Time Calculation (min): 20 min Charges:  OT General Charges $OT Visit: 1 Visit OT Evaluation $OT Eval Low Complexity: 1 Low  Marica Otter, OTR/L Acute Rehabilitation Services [phone] [facility] pager [phone] office 02/06/2018  Fard Borunda 02/06/2018, 11:28 AM

## 2018-02-06 NOTE — NC FL2 (Addendum)
[place] MEDICAID FL2 LEVEL OF CARE SCREENING TOOL     IDENTIFICATION  Patient Name: Teresa Bates Birthdate: 11/25/1929 Sex: female Admission Date (Current Location): 01/31/2018  [facility] and [place] Number:  Producer, television/film/video and Address:  [facility],  [address] [place], [place] [address]      Provider Number: [id number]  Attending Physician Name and Address:  Merlene Laughter, DO  Relative Name and Phone Number:       Current Level of Care: Hospital Recommended Level of Care: Skilled Nursing Facility Prior Approval Number:    Date Approved/Denied:   PASRR Number: [id number]   Discharge Plan:      Current Diagnoses: Patient Active Problem List   Diagnosis Date Noted  . Hypomagnesemia 02/05/2018  . Depression 02/05/2018  . Essential hypertension 02/05/2018  . Parkinson's disease (HCC) 02/05/2018  . Lumbar compression fracture (HCC) 01/31/2018    Orientation RESPIRATION BLADDER Height & Weight     Self, Time, Situation, Place  Normal Continent Weight: 105 lb 13.1 oz (48 kg) Height:  5' (152.4 cm)  BEHAVIORAL SYMPTOMS/MOOD NEUROLOGICAL BOWEL NUTRITION STATUS      Continent Diet(Low Sodium Heart Healthy )  AMBULATORY STATUS COMMUNICATION OF NEEDS Skin   Extensive Assist Verbally Normal                       Personal Care Assistance Level of Assistance  Bathing, Feeding, Dressing Bathing Assistance: Maximum assistance Feeding assistance: Independent Dressing Assistance: Maximum assistance     Functional Limitations Info  Sight, Hearing, Speech Sight Info: Adequate Hearing Info: Adequate Speech Info: Adequate    SPECIAL CARE FACTORS FREQUENCY  PT (By licensed PT), OT (By licensed OT)     PT Frequency: 5x/week OT Frequency: 5x/week             Contractures Contractures Info: Not present    Additional Factors Info  Code Status, Allergies, Psychotropic Code Status Info: fullcode  Allergies Info: Allergies:  Carbamazepine Psychotropic Info: Celexa          Current Medications (02/06/2018):  This is the current hospital active medication list Current Facility-Administered Medications  Medication Dose Route Frequency Provider Last Rate Last Dose  . acetaminophen (TYLENOL) tablet 500 mg  500 mg Oral Q6H Marcelo Baldy, MD   500 mg at 02/06/18 0957  . apixaban (ELIQUIS) tablet 2.5 mg  2.5 mg Oral BID Maurice March, RPH   2.5 mg at 02/06/18 0957  . bisacodyl (DULCOLAX) suppository 10 mg  10 mg Rectal Daily PRN Sheikh, Omair Latif, DO      . calcium-vitamin D (OSCAL WITH D) 500-200 MG-UNIT per tablet 1 tablet  1 tablet Oral Q breakfast Marcelo Baldy, MD   1 tablet at 02/06/18 0957  . citalopram (CELEXA) tablet 20 mg  20 mg Oral QHS Marcelo Baldy, MD   20 mg at 02/05/18 2131  . HYDROmorphone (DILAUDID) injection 0.5 mg  0.5 mg Intravenous Q4H PRN Marcelo Baldy, MD   0.5 mg at 02/05/18 7846  . lisinopril (PRINIVIL,ZESTRIL) tablet 2.5 mg  2.5 mg Oral QHS Sheikh, Kateri [place], DO   2.5 mg at 02/05/18 2130  . metoprolol succinate (TOPROL-XL) 24 hr tablet 25 mg  25 mg Oral Daily Marcelo Baldy, MD   25 mg at 02/06/18 0958  . ondansetron (ZOFRAN) tablet 4 mg  4 mg Oral Q6H PRN Marcelo Baldy, MD       Or  . ondansetron (  ZOFRAN) injection 4 mg  4 mg Intravenous Q6H PRN Marcelo Baldy, MD      . oxyCODONE (Oxy IR/ROXICODONE) immediate release tablet 5 mg  5 mg Oral Q3H PRN Marguerita Merles [place], DO   5 mg at 02/06/18 2130  . pantoprazole (PROTONIX) EC tablet 40 mg  40 mg Oral Daily Marcelo Baldy, MD   40 mg at 02/06/18 0959  . polyethylene glycol (MIRALAX / GLYCOLAX) packet 17 g  17 g Oral BID Marguerita Merles [place], DO   17 g at 02/06/18 8657  . pramipexole (MIRAPEX) tablet 0.25 mg  0.25 mg Oral TID Marcelo Baldy, MD   0.25 mg at 02/06/18 0958  . pregabalin (LYRICA) capsule 75 mg  75 mg Oral BID Marcelo Baldy, MD   75 mg at 02/06/18 0958  . senna-docusate (Senokot-S)  tablet 1 tablet  1 tablet Oral BID Marguerita Merles [place], [place]   1 tablet at 02/06/18 8469     Discharge Medications: Please see discharge summary for a list of discharge medications.  Relevant Imaging Results:  Relevant Lab Results:   Additional Information ssn:[id number]/ Compression fractures at T10, L1-5. Paer orhto, wear TLSO when OOB.  Clearance Coots, LCSW

## 2018-02-06 NOTE — Care Management Important Message (Signed)
Important Message  Patient Details  Name: Teresa Bates MRN: [id number] Date of Birth: 01-05-1930   Medicare Important Message Given:  Yes    Caren Macadam 02/06/2018, 1:35 PMImportant Message  Patient Details  Name: Teresa Bates MRN: [id number] Date of Birth: [birth year]   Medicare Important Message Given:  Yes    Caren Macadam 02/06/2018, 1:35 PM

## 2018-02-06 NOTE — Discharge Summary (Addendum)
Physician Discharge Summary  Teresa Bates ZOX:[id number] DOB: 1930/01/01 DOA: 01/31/2018  PCP: Thurman Coyer, MD  Admit date: 01/31/2018 Discharge date: 02/07/2018  Admitted From: Home Disposition: SNF  Recommendations for Outpatient Follow-up:  1. Follow up with PCP in 1-2 weeks 2. Have Abdominal U/S done as an outpatient for Bloating 3. Follow up with Orthopedic Surgery Dr. Yevette Edwards in 2-4 weeks for follow up  4. Please obtain CMP/CBC, Mag, Phos in one week 5. Please follow up on the following pending results:  Home Health: No Equipment/Devices: Rolling Walker with 5" wheels; 3 in 1 Bedside Commode    Discharge Condition: Stable CODE STATUS: FULL CODE Diet recommendation: Heart Healthy Diet   Brief/Interim Summary: HPI per Dr. Koren Bound on 01/31/18 Teresa Armijois a 84 y.o.Spanish-speakingfemalewith medical history significant forA. fib on Eliquis, PPM placement in 2015 [facility]. [phone], serial #[id number]), Parkinson's disease, hypertension, depression/anxiety and chronic back pain who presents to the ED with 2 months of progressively worsening lower back pain that is stabbing in nature with radiation to the bilateral thighs and is limiting activities due to pain.History mostly obtained from patient's son, Sherilyn Cooter.No recent falls or trauma. No focal weakness, numbness, tingling in either lower extremity. No loss of bowel or bladder continence, no urinary retention. No visual disturbance or change in speech. There is no known history of malignancy. Patient is currently visiting her son from [place], and he was concerned about her worsening pain that was uncontrolled with home Norco.  ED Course:In the ED, patient was in severe pain requiring multiple doses of IV narcotics. He was afebrile, mildly tachycardic, hypertensive and saturating comfortably on room air. Labs showed no abnormality. Urinalysis was unremarkable. X-ray of the T and L-spine showed  severe T9 fracture and L1-L5 compression fractures of unknown acuity. Orthopedic surgery was consulted and recommended admission for pain control and likely nonoperative management of her lumbar compression fractures.   **Patient unfortunately unable to go under MRI due to incompatibility with pacemaker.  Bone scan has been done.  Orthopedic surgery to evaluated and still feel that the best course of action for the patient is continue treatment TLSO brace as a fractures were healing time but may take 2 to 3 months.  Per Dr. Yevette Edwards pain is expected to improve gradually as fractures he will in the brace is not needed while the patient is laying flat.  PT/OT ordered after I discussed the case with Dr. Yevette Edwards who recommends the patient have no aggressive physical therapy done but work with PT and OT to work on gait training and transfers.  Plan is to discharge to skilled nursing facility as she is deemed medically stable and pain is better controlled.  Constipation is improved.  Patient will need to follow-up with primary care physician along with orthopedic surgeon outpatient setting.  She also need an abdominal ultrasound given family concern for bloating.  Discharge Diagnoses:  Active Problems:   Lumbar compression fracture (HCC)   Hypomagnesemia   Depression   Essential hypertension   Parkinson's disease (HCC)  Thoracic and Lumbar Compression Fractures without Cauda Equina -Orthopedic surgery consulted and recommending TLSO brace and is likely can be treated definitively with this however will need further imaging studies; patient unable to tolerate TLSO brace while laying flat in bed and states that "it slides up". -MRI of the thoracic and lumbar spine is unable to be done secondary to incompatibility of pacemaker p.m. 2240 serial #[id number] with MRI machines -Ordered a whole-body bone scan to further evaluate  Acuity of Fractures since MRI cannot be done; Bone scan done and showed multiple  healing bilateral rib fractures, healing T7, T9, T11, and L2 vertebral compression fractures, and degenerative change or healing focal fracture in the left medial tibial plateau -Pain control with scheduled Tylenol, home Lyrica, as needed Oxycodone 5 mg q3hprn, low-dose 0.5 mg IV Dilaudid for breakthrough; Will D/C on Oxycodone 5-10 mg q3hprn for Severe and Breakthrough; Also continue home Tramadol TIDprn  -Dr. Yevette Edwards reviewed the bone scan which was notable for multiple compression fractures as well as rib fractures and continues to recommend treatment in the TLSO brace.  Family was concerned and wanted to discuss with Dr. Yevette Edwards about kyphoplasty/vertebroplasty and I will leave that discussion up to Dr. Yevette Edwards and the family and consensus is non-operative management  -PT and OT ordered and Dr. Yevette Edwards recommends no aggressive physical therapy that would cause strenuous activity on the back but does recommend therapy for gait training and transfers -PT evaluated and recommending SNF -Started calcium and vitamin D supplementation -Bowel regimen being adjusted as below -Child psychotherapist involved for assistance with placement offered patient and her family bed offers but will need insurance authorization and to pick a facility; Medically stable to D/C to SNF  A. Fib -Continue home Toprol XL 25 mg po Daily  -Switched Eliquis to Lovenox 1 mg/kg initially while awaiting formal decision on operative versus non-operative management but have now switched to Eliquis now that Dr. Yevette Edwards is recommending conservative measures with just a TLSO brace and no operative management   Constipation, improved  -Patient states that she has not since being hospitalized -Have adjusted bowel regimen to the patient on senna docusate 1 tab p.o. twice daily, scheduled MiraLAX 17 g p.o. twice daily, as well as PRN bisacodyl suppository 10 mg and give one-time dose of bisacodyl suppository today -If not improved will  consider enema but now improved -Bowel Regimen as above for SNF  HTN -C/w Metoprolol Succinate 25 mg po Daily and Lisiniopril 2.5 mg p.o. Nightly -Pressure was elevated earlier likely secondary to pain and is now 161/85  Anxiety and Depression -Continue Home Citalopram 20 mg po qHS  Hypokalemia -Patient's K+ was 3.4 and improved to 3.9 -Continue to Monitor and Replete as Necessary -Repeat CMP in AM   Parkinson's  -Continue with Lyrica 75 mg p.o. twice daily along with Pramipexole 0.25 mg p.o. 3 times daily  GERD -Continue with Omeprazole substitution with Protonix 40 mg p.o. Daily  Normocytic Anemia -Patient's hemoglobin/hematocrit went from 12.4/38.8 and is now 12.1/37.9 -Checked Anemia Panel and showed an iron level 45, U IBC 163, TIBC of 208, saturation ratio is 22%, ferritin level of 371, folate level of 5.3, vitamin B12 level 414 -Continue monitor for signs and symptoms of bleeding -Repeat CBC as an outpatient   Hyperglycemia in the setting of Impaired Fasting Glucose/PreDiabetes -Patient blood sugar on daily BMPs has ranged from 85-102 -Checked hemoglobin A1c and was 5.8 -If blood sugars continually remain elevated will place on sensitive NovoLog sliding scale AC but will hold off for now  Hypomagnesemia -Patient magnesium level was 1.6 and repeat this AM was 2.2 -Replete with IV mag sulfate 2 g yesterday -Continue monitor replete as necessary -Repeat Magnesium level as an outpatient   Discharge Instructions Discharge Instructions    Call MD for:  difficulty breathing, headache or visual disturbances   Complete by:  As directed    Call MD for:  extreme fatigue   Complete by:  As directed  Call MD for:  hives   Complete by:  As directed    Call MD for:  persistant dizziness or light-headedness   Complete by:  As directed    Call MD for:  persistant nausea and vomiting   Complete by:  As directed    Call MD for:  redness, tenderness, or signs of  infection (pain, swelling, redness, odor or green/yellow discharge around incision site)   Complete by:  As directed    Call MD for:  severe uncontrolled pain   Complete by:  As directed    Call MD for:  temperature >100.4   Complete by:  As directed    Diet - low sodium heart healthy   Complete by:  As directed    Discharge instructions   Complete by:  As directed    You were cared for by a hospitalist during your hospital stay. If you have any questions about your discharge medications or the care you received while you were in the hospital after you are discharged, you can call the unit and ask to speak with the hospitalist on call if the hospitalist that took care of you is not available. Once you are discharged, your primary care physician will handle any further medical issues. Please note that NO REFILLS for any discharge medications will be authorized once you are discharged, as it is imperative that you return to your primary care physician (or establish a relationship with a primary care physician if you do not have one) for your aftercare needs so that they can reassess your need for medications and monitor your lab values.  Follow up with PCP and Orthopedic Surgery. Take all medications as prescribed. If symptoms change or worsen please return to the ED for evaluation   Increase activity slowly   Complete by:  As directed    Wear TLSO Brace when out of bed. Work with PT and work on Investment banker, operational and Transfers     Allergies as of 02/06/2018      Reactions   Carbamazepine Rash      Medication List    STOP taking these medications   docusate sodium 100 MG capsule Commonly known as:  COLACE   HYDROcodone-acetaminophen 5-325 MG tablet Commonly known as:  NORCO/VICODIN     TAKE these medications   acetaminophen 500 MG tablet Commonly known as:  TYLENOL Take 1 tablet (500 mg total) by mouth every 6 (six) hours. What changed:    how much to take  when to take this    bisacodyl 10 MG suppository Commonly known as:  DULCOLAX Place 1 suppository (10 mg total) rectally daily as needed for moderate constipation.   calcium-vitamin D 500-200 MG-UNIT tablet Commonly known as:  OSCAL WITH D Take 1 tablet by mouth daily with breakfast.   citalopram 20 MG tablet Commonly known as:  CELEXA Take 20 mg by mouth at bedtime.   ELIQUIS 2.5 MG Tabs tablet Generic drug:  apixaban Take 2.5 mg by mouth 2 (two) times daily.   lisinopril 2.5 MG tablet Commonly known as:  PRINIVIL,ZESTRIL Take 2.5 mg by mouth at bedtime.   magnesium hydroxide 400 MG/5ML suspension Commonly known as:  MILK OF MAGNESIA Take 30 mLs by mouth daily.   metoprolol succinate 25 MG 24 hr tablet Commonly known as:  TOPROL-XL Take 12.5 mg by mouth 2 (two) times daily.   omeprazole 20 MG capsule Commonly known as:  PRILOSEC Take 20 mg by mouth daily.   ondansetron  4 MG tablet Commonly known as:  ZOFRAN Take 1 tablet (4 mg total) by mouth every 6 (six) hours as needed for nausea.   oxyCODONE 5 MG immediate release tablet Commonly known as:  Oxy IR/ROXICODONE Take 1-2 tablets (5-10 mg total) by mouth every 3 (three) hours as needed for severe pain or breakthrough pain (Take 5 mg q3hprn and then an additioanl 5 mg for breakthrough).   polyethylene glycol packet Commonly known as:  MIRALAX / GLYCOLAX Take 17 g by mouth 2 (two) times daily.   pramipexole 0.25 MG tablet Commonly known as:  MIRAPEX Take 0.25 mg by mouth 3 (three) times daily.   pregabalin 75 MG capsule Commonly known as:  LYRICA Take 75 mg by mouth 2 (two) times daily.   senna-docusate 8.6-50 MG tablet Commonly known as:  Senokot-S Take 1 tablet by mouth 2 (two) times daily.   traMADol 50 MG tablet Commonly known as:  ULTRAM Take 1 tablet (50 mg total) by mouth 3 (three) times daily as needed for moderate pain.       Allergies  Allergen Reactions  . Carbamazepine Rash   Consultations:  Orthopedic  Surgery  Procedures/Studies: Dg Lumbar Spine 2-3 Views  Result Date: 01/31/2018 CLINICAL DATA:  Back pain for 2 months, abdominal pain for 1 month. History of compression fractures. EXAM: LUMBAR SPINE - 2-3 VIEW COMPARISON:  None. FINDINGS: Limited assessment due to severe osteopenia. Moderate L1, moderate L2, mild L3, moderate L4 and mild L5 compression fractures. Additional thoracic compression fractures, severe at T9. No malalignment. Maintenance of lumbar lordosis. Cardiac pacemaker wires in place. Included prevertebral and paraspinal soft tissue planes are non suspicious. IMPRESSION: 1. Limited assessment due to severe osteopenia. 2. Age indeterminate compression fractures all lumbar levels, moderate at L1 and L4. Severe T9 compression fracture. Electronically Signed   By: Awilda Metro M.D.   On: 01/31/2018 17:39   Nm Bone Scan Whole Body  Result Date: 02/02/2018 CLINICAL DATA:  Mid and bilateral lower back pain for the past month. The patient fell in January 2019 and again in August 2019. History of severe arthritis and osteoporosis. EXAM: NUCLEAR MEDICINE WHOLE BODY BONE SCAN TECHNIQUE: Whole body anterior and posterior images were obtained approximately 3 hours after intravenous injection of radiopharmaceutical. RADIOPHARMACEUTICALS:  21.3 mCi Technetium-29m MDP IV COMPARISON:  Lumbar spine radiographs dated 01/31/2018. FINDINGS: Focal areas of increased tracer uptake in multiple bilateral ribs, aligned in a linear fashion from 1 rib to the necks. Increased tracer uptake in the T7, T9, T11 and L2 vertebral bodies with flattening of the vertebral bodies. Focally increased tracer activity in the medial left knee at the medial tibial plateau. Normal renal and bladder activity. IMPRESSION: 1. Multiple healing bilateral rib fractures. 2. Healing T7, T9, T11 and L2 vertebral compression fractures. 3. Degenerative change or healing focal fracture in the left medial tibial plateau. Electronically  Signed   By: Beckie Salts M.D.   On: 02/02/2018 17:22   Subjective: Seen And examined at bedside and patient was doing better and pain was better controlled.  Received IV pain medications once in the last 24 hours.  Patient denied any chest pain, lightheadedness or dizziness.  Did have some abdominal soreness.  Family is concerned about abdominal bloating however abdomen is soft on palpation and asked about obtaining ultrasound of the abdomen which can be done as outpatient.  Patient was deemed medically stable to be discharged to skilled nursing facility she will follow-up with primary care physician as well  as orthopedic surgeon outpatient setting.  Discharge Exam: Vitals:   02/06/18 0957 02/06/18 1146  BP: 106/62 (!) 161/85  Pulse: 86 80  Resp:  20  Temp:  97.7 F (36.5 C)  SpO2:  95%   Vitals:   02/06/18 0548 02/06/18 0700 02/06/18 0957 02/06/18 1146  BP: 134/80  106/62 (!) 161/85  Pulse: 77  86 80  Resp:    20  Temp: 97.6 F (36.4 C)   97.7 F (36.5 C)  TempSrc: Oral   Oral  SpO2: 94%   95%  Weight:  48 kg    Height:       General: Pt is a thin frail elderly female who is alert, awake, not in acute distress Cardiovascular: RRR, S1/S2 +, no rubs, no gallops Respiratory: Diminished bilaterally, no wheezing, no rhonchi Abdominal: Soft, NT, Mildly distended, bowel sounds + Extremities: no edema, no cyanosis  The results of significant diagnostics from this hospitalization (including imaging, microbiology, ancillary and laboratory) are listed below for reference.    Microbiology: No results found for this or any previous visit (from the past 240 hour(s)).   Labs: BNP (last 3 results) No results for input(s): BNP in the last 8760 hours. Basic Metabolic Panel: Recent Labs  Lab 02/02/18 0452 02/03/18 0344 02/04/18 0359 02/05/18 0406 02/06/18 0524  NA 137 136 140 138 140  K 3.7 4.0 3.8 4.0 3.9  CL 102 103 106 103 106  CO2 25 24 27 27 26   GLUCOSE 96 98 98 97 102*  BUN  21 20 17 15 16   CREATININE 0.66 0.76 0.72 0.71 0.76  CALCIUM 9.0 9.1 9.4 9.1 9.3  MG 1.7 1.7 1.7 1.6* 2.2  PHOS 3.3 3.3 3.9 3.5 3.5   Liver Function Tests: Recent Labs  Lab 02/02/18 0452 02/03/18 0344 02/04/18 0359 02/05/18 0406 02/06/18 0524  AST 23 27 29 31  32  ALT 13 13 16 19 20   ALKPHOS 121 126 125 116 122  BILITOT 0.7 0.8 0.8 0.6 0.6  PROT 5.6* 5.7* 5.9* 5.4* 5.9*  ALBUMIN 2.6* 2.7* 2.9* 2.7* 2.9*   No results for input(s): LIPASE, AMYLASE in the last 168 hours. No results for input(s): AMMONIA in the last 168 hours. CBC: Recent Labs  Lab 02/02/18 0452 02/03/18 0344 02/04/18 0359 02/05/18 0406 02/06/18 0524  WBC 6.0 6.2 5.0 5.1 5.9  NEUTROABS 3.8 3.7 2.8 2.8 3.5  HGB 11.8* 12.2 12.0 11.9* 12.1  HCT 36.7 37.7 37.4 36.8 37.9  MCV 100.0 98.7 98.9 98.9 99.5  PLT 299 317 310 294 317   Cardiac Enzymes: No results for input(s): CKTOTAL, CKMB, CKMBINDEX, TROPONINI in the last 168 hours. BNP: Invalid input(s): POCBNP CBG: Recent Labs  Lab 02/02/18 0729 02/03/18 0757 02/04/18 0742 02/05/18 0717 02/06/18 0751  GLUCAP 89 95 85 90 97   D-Dimer No results for input(s): DDIMER in the last 72 hours. Hgb A1c No results for input(s): HGBA1C in the last 72 hours. Lipid Profile No results for input(s): CHOL, HDL, LDLCALC, TRIG, CHOLHDL, LDLDIRECT in the last 72 hours. Thyroid function studies No results for input(s): TSH, T4TOTAL, T3FREE, THYROIDAB in the last 72 hours.  Invalid input(s): FREET3 Anemia work up No results for input(s): VITAMINB12, FOLATE, FERRITIN, TIBC, IRON, RETICCTPCT in the last 72 hours. Urinalysis    Component Value Date/Time   COLORURINE STRAW (A) 01/31/2018 1520   APPEARANCEUR CLEAR 01/31/2018 1520   LABSPEC 1.006 01/31/2018 1520   PHURINE 8.0 01/31/2018 1520   GLUCOSEU NEGATIVE 01/31/2018 1520  HGBUR MODERATE (A) 01/31/2018 1520   BILIRUBINUR NEGATIVE 01/31/2018 1520   KETONESUR NEGATIVE 01/31/2018 1520   PROTEINUR NEGATIVE  01/31/2018 1520   NITRITE NEGATIVE 01/31/2018 1520   LEUKOCYTESUR NEGATIVE 01/31/2018 1520   Sepsis Labs Invalid input(s): PROCALCITONIN,  WBC,  LACTICIDVEN Microbiology No results found for this or any previous visit (from the past 240 hour(s)).  Time coordinating discharge: 35 minutes  SIGNED:  Merlene Laughter, DO [facility] 02/06/2018, 12:03 PM Pager is on AMION  If 7PM-7AM, please contact night-coverage [email or web address] Password [email or web address]

## 2018-02-06 NOTE — Plan of Care (Signed)
Pt is stable. Pain management in progress, effective. Plane of care reviewed with pt.

## 2018-02-07 DIAGNOSIS — Z23 Encounter for immunization: Secondary | ICD-10-CM | POA: Diagnosis not present

## 2018-02-07 DIAGNOSIS — R262 Difficulty in walking, not elsewhere classified: Secondary | ICD-10-CM | POA: Diagnosis not present

## 2018-02-07 DIAGNOSIS — R41841 Cognitive communication deficit: Secondary | ICD-10-CM | POA: Diagnosis not present

## 2018-02-07 DIAGNOSIS — S32000D Wedge compression fracture of unspecified lumbar vertebra, subsequent encounter for fracture with routine healing: Secondary | ICD-10-CM | POA: Diagnosis not present

## 2018-02-07 DIAGNOSIS — D649 Anemia, unspecified: Secondary | ICD-10-CM | POA: Diagnosis not present

## 2018-02-07 DIAGNOSIS — E162 Hypoglycemia, unspecified: Secondary | ICD-10-CM | POA: Diagnosis not present

## 2018-02-07 DIAGNOSIS — I4891 Unspecified atrial fibrillation: Secondary | ICD-10-CM | POA: Diagnosis not present

## 2018-02-07 DIAGNOSIS — R52 Pain, unspecified: Secondary | ICD-10-CM | POA: Diagnosis not present

## 2018-02-07 DIAGNOSIS — M2550 Pain in unspecified joint: Secondary | ICD-10-CM | POA: Diagnosis not present

## 2018-02-07 DIAGNOSIS — S22000D Wedge compression fracture of unspecified thoracic vertebra, subsequent encounter for fracture with routine healing: Secondary | ICD-10-CM | POA: Diagnosis not present

## 2018-02-07 DIAGNOSIS — F329 Major depressive disorder, single episode, unspecified: Secondary | ICD-10-CM | POA: Diagnosis not present

## 2018-02-07 DIAGNOSIS — G20 Parkinson's disease: Secondary | ICD-10-CM | POA: Diagnosis not present

## 2018-02-07 DIAGNOSIS — K219 Gastro-esophageal reflux disease without esophagitis: Secondary | ICD-10-CM | POA: Diagnosis not present

## 2018-02-07 DIAGNOSIS — I10 Essential (primary) hypertension: Secondary | ICD-10-CM | POA: Diagnosis not present

## 2018-02-07 DIAGNOSIS — F339 Major depressive disorder, recurrent, unspecified: Secondary | ICD-10-CM | POA: Diagnosis not present

## 2018-02-07 DIAGNOSIS — K5900 Constipation, unspecified: Secondary | ICD-10-CM | POA: Diagnosis not present

## 2018-02-07 DIAGNOSIS — R110 Nausea: Secondary | ICD-10-CM | POA: Diagnosis not present

## 2018-02-07 DIAGNOSIS — Z7401 Bed confinement status: Secondary | ICD-10-CM | POA: Diagnosis not present

## 2018-02-07 DIAGNOSIS — E876 Hypokalemia: Secondary | ICD-10-CM | POA: Diagnosis not present

## 2018-02-07 DIAGNOSIS — M6281 Muscle weakness (generalized): Secondary | ICD-10-CM | POA: Diagnosis not present

## 2018-02-07 DIAGNOSIS — H409 Unspecified glaucoma: Secondary | ICD-10-CM | POA: Diagnosis not present

## 2018-02-07 LAB — GLUCOSE, CAPILLARY: GLUCOSE-CAPILLARY: 108 mg/dL — AB (ref 70–99)

## 2018-02-07 NOTE — Clinical Social Work Placement (Addendum)
Patient discharging to [facility] SNF.  CSW confirmed bed at facility and faxed appropriate documents. [facility] will be called for transport at 3:00 pm per son's request. Son, Teresa Bates, made aware.   RN call report to: [phone]  No more CSW needs. Signing off.  CLINICAL SOCIAL WORK PLACEMENT  NOTE  Date:  02/07/2018  Patient Details  Name: Teresa Bates MRN: [id number] Date of Birth: 06-28-1929  Clinical Social Work is seeking post-discharge placement for this patient at the Skilled  Nursing Facility level of care (*CSW will initial, date and re-position this form in  chart as items are completed):  Yes   Patient/family provided with [facility] Clinical Social Work Department's list of facilities offering this level of care within the geographic area requested by the patient (or if unable, by the patient's family).  Yes   Patient/family informed of their freedom to choose among providers that offer the needed level of care, that participate in Medicare, Medicaid or managed care program needed by the patient, have an available bed and are willing to accept the patient.  Yes   Patient/family informed of [facility]'s ownership interest in [facility] and [facility], as well as of the fact that they are under no obligation to receive care at these facilities.  PASRR submitted to EDS on 02/05/18     PASRR number received on 02/07/18     Existing PASRR number confirmed on       FL2 transmitted to all facilities in geographic area requested by pt/family on       FL2 transmitted to all facilities within larger geographic area on 02/02/18     Patient informed that his/her managed care company has contracts with or will negotiate with certain facilities, including the following:        Yes   Patient/family informed of bed offers received.  Patient chooses bed at [facility]     Physician recommends and patient chooses bed at      Patient to be transferred to  [facility] on 02/07/18.  Patient to be transferred to facility by [facility]     Patient family notified on 02/07/18 of transfer.  Name of family member notified:  Son, Teresa Bates     PHYSICIAN       Additional Comment:    _______________________________________________ Enid Cutter, LCSWA 02/07/2018, 10:10 AM

## 2018-02-07 NOTE — Plan of Care (Signed)
Pt is stable.  Problem: Pain Managment: Goal: General experience of comfort will improve Outcome: Progressing   Problem: Elimination: Goal: Will not experience complications related to bowel motility Outcome: Progressing   Problem: Nutrition: Goal: Adequate nutrition will be maintained Outcome: Progressing   Problem: Activity: Goal: Risk for activity intolerance will decrease Outcome: Progressing   Problem: Clinical Measurements: Goal: Ability to maintain clinical measurements within normal limits will improve Outcome: Progressing

## 2018-02-08 ENCOUNTER — Other Ambulatory Visit: Payer: Self-pay | Admitting: Orthopedic Surgery

## 2018-02-09 DIAGNOSIS — I10 Essential (primary) hypertension: Secondary | ICD-10-CM | POA: Diagnosis not present

## 2018-02-09 DIAGNOSIS — F339 Major depressive disorder, recurrent, unspecified: Secondary | ICD-10-CM | POA: Diagnosis not present

## 2018-02-09 DIAGNOSIS — E162 Hypoglycemia, unspecified: Secondary | ICD-10-CM | POA: Diagnosis not present

## 2018-02-09 DIAGNOSIS — S22000D Wedge compression fracture of unspecified thoracic vertebra, subsequent encounter for fracture with routine healing: Secondary | ICD-10-CM | POA: Diagnosis not present

## 2018-02-09 DIAGNOSIS — D649 Anemia, unspecified: Secondary | ICD-10-CM | POA: Diagnosis not present

## 2018-02-09 DIAGNOSIS — M6281 Muscle weakness (generalized): Secondary | ICD-10-CM | POA: Diagnosis not present

## 2018-02-09 DIAGNOSIS — K219 Gastro-esophageal reflux disease without esophagitis: Secondary | ICD-10-CM | POA: Diagnosis not present

## 2018-02-09 DIAGNOSIS — E876 Hypokalemia: Secondary | ICD-10-CM | POA: Diagnosis not present

## 2018-02-09 DIAGNOSIS — S32000D Wedge compression fracture of unspecified lumbar vertebra, subsequent encounter for fracture with routine healing: Secondary | ICD-10-CM | POA: Diagnosis not present

## 2018-02-09 DIAGNOSIS — G20 Parkinson's disease: Secondary | ICD-10-CM | POA: Diagnosis not present

## 2018-02-09 DIAGNOSIS — I4891 Unspecified atrial fibrillation: Secondary | ICD-10-CM | POA: Diagnosis not present

## 2018-02-09 DIAGNOSIS — K5900 Constipation, unspecified: Secondary | ICD-10-CM | POA: Diagnosis not present

## 2018-02-12 DIAGNOSIS — S32000D Wedge compression fracture of unspecified lumbar vertebra, subsequent encounter for fracture with routine healing: Secondary | ICD-10-CM | POA: Diagnosis not present

## 2018-02-12 DIAGNOSIS — E876 Hypokalemia: Secondary | ICD-10-CM | POA: Diagnosis not present

## 2018-02-12 DIAGNOSIS — I10 Essential (primary) hypertension: Secondary | ICD-10-CM | POA: Diagnosis not present

## 2018-02-12 DIAGNOSIS — D649 Anemia, unspecified: Secondary | ICD-10-CM | POA: Diagnosis not present

## 2018-02-12 DIAGNOSIS — K219 Gastro-esophageal reflux disease without esophagitis: Secondary | ICD-10-CM | POA: Diagnosis not present

## 2018-02-12 DIAGNOSIS — S22000D Wedge compression fracture of unspecified thoracic vertebra, subsequent encounter for fracture with routine healing: Secondary | ICD-10-CM | POA: Diagnosis not present

## 2018-02-12 DIAGNOSIS — E162 Hypoglycemia, unspecified: Secondary | ICD-10-CM | POA: Diagnosis not present

## 2018-02-12 DIAGNOSIS — I4891 Unspecified atrial fibrillation: Secondary | ICD-10-CM | POA: Diagnosis not present

## 2018-02-12 DIAGNOSIS — K5900 Constipation, unspecified: Secondary | ICD-10-CM | POA: Diagnosis not present

## 2018-02-12 DIAGNOSIS — M6281 Muscle weakness (generalized): Secondary | ICD-10-CM | POA: Diagnosis not present

## 2018-02-12 DIAGNOSIS — F339 Major depressive disorder, recurrent, unspecified: Secondary | ICD-10-CM | POA: Diagnosis not present

## 2018-02-12 DIAGNOSIS — G20 Parkinson's disease: Secondary | ICD-10-CM | POA: Diagnosis not present

## 2018-02-13 DIAGNOSIS — S22000D Wedge compression fracture of unspecified thoracic vertebra, subsequent encounter for fracture with routine healing: Secondary | ICD-10-CM | POA: Diagnosis not present

## 2018-02-13 DIAGNOSIS — R110 Nausea: Secondary | ICD-10-CM | POA: Diagnosis not present

## 2018-02-13 DIAGNOSIS — E876 Hypokalemia: Secondary | ICD-10-CM | POA: Diagnosis not present

## 2018-02-13 DIAGNOSIS — I10 Essential (primary) hypertension: Secondary | ICD-10-CM | POA: Diagnosis not present

## 2018-02-13 DIAGNOSIS — K5900 Constipation, unspecified: Secondary | ICD-10-CM | POA: Diagnosis not present

## 2018-02-13 DIAGNOSIS — D649 Anemia, unspecified: Secondary | ICD-10-CM | POA: Diagnosis not present

## 2018-02-13 DIAGNOSIS — M6281 Muscle weakness (generalized): Secondary | ICD-10-CM | POA: Diagnosis not present

## 2018-02-13 DIAGNOSIS — E162 Hypoglycemia, unspecified: Secondary | ICD-10-CM | POA: Diagnosis not present

## 2018-02-13 DIAGNOSIS — G20 Parkinson's disease: Secondary | ICD-10-CM | POA: Diagnosis not present

## 2018-02-13 DIAGNOSIS — S32000D Wedge compression fracture of unspecified lumbar vertebra, subsequent encounter for fracture with routine healing: Secondary | ICD-10-CM | POA: Diagnosis not present

## 2018-02-13 DIAGNOSIS — I4891 Unspecified atrial fibrillation: Secondary | ICD-10-CM | POA: Diagnosis not present

## 2018-02-13 DIAGNOSIS — K219 Gastro-esophageal reflux disease without esophagitis: Secondary | ICD-10-CM | POA: Diagnosis not present

## 2018-02-16 DIAGNOSIS — S22000D Wedge compression fracture of unspecified thoracic vertebra, subsequent encounter for fracture with routine healing: Secondary | ICD-10-CM | POA: Diagnosis not present

## 2018-02-16 DIAGNOSIS — E162 Hypoglycemia, unspecified: Secondary | ICD-10-CM | POA: Diagnosis not present

## 2018-02-16 DIAGNOSIS — E876 Hypokalemia: Secondary | ICD-10-CM | POA: Diagnosis not present

## 2018-02-16 DIAGNOSIS — D649 Anemia, unspecified: Secondary | ICD-10-CM | POA: Diagnosis not present

## 2018-02-16 DIAGNOSIS — I4891 Unspecified atrial fibrillation: Secondary | ICD-10-CM | POA: Diagnosis not present

## 2018-02-16 DIAGNOSIS — K219 Gastro-esophageal reflux disease without esophagitis: Secondary | ICD-10-CM | POA: Diagnosis not present

## 2018-02-16 DIAGNOSIS — G20 Parkinson's disease: Secondary | ICD-10-CM | POA: Diagnosis not present

## 2018-02-16 DIAGNOSIS — R110 Nausea: Secondary | ICD-10-CM | POA: Diagnosis not present

## 2018-02-16 DIAGNOSIS — K5900 Constipation, unspecified: Secondary | ICD-10-CM | POA: Diagnosis not present

## 2018-02-16 DIAGNOSIS — S32000D Wedge compression fracture of unspecified lumbar vertebra, subsequent encounter for fracture with routine healing: Secondary | ICD-10-CM | POA: Diagnosis not present

## 2018-02-16 DIAGNOSIS — M6281 Muscle weakness (generalized): Secondary | ICD-10-CM | POA: Diagnosis not present

## 2018-02-16 DIAGNOSIS — I10 Essential (primary) hypertension: Secondary | ICD-10-CM | POA: Diagnosis not present

## 2018-02-19 DIAGNOSIS — G20 Parkinson's disease: Secondary | ICD-10-CM | POA: Diagnosis not present

## 2018-02-19 DIAGNOSIS — S32000D Wedge compression fracture of unspecified lumbar vertebra, subsequent encounter for fracture with routine healing: Secondary | ICD-10-CM | POA: Diagnosis not present

## 2018-02-19 DIAGNOSIS — H409 Unspecified glaucoma: Secondary | ICD-10-CM | POA: Diagnosis not present

## 2018-02-19 DIAGNOSIS — I10 Essential (primary) hypertension: Secondary | ICD-10-CM | POA: Diagnosis not present

## 2018-02-19 DIAGNOSIS — S22000D Wedge compression fracture of unspecified thoracic vertebra, subsequent encounter for fracture with routine healing: Secondary | ICD-10-CM | POA: Diagnosis not present

## 2018-02-19 DIAGNOSIS — K219 Gastro-esophageal reflux disease without esophagitis: Secondary | ICD-10-CM | POA: Diagnosis not present

## 2018-02-19 DIAGNOSIS — M6281 Muscle weakness (generalized): Secondary | ICD-10-CM | POA: Diagnosis not present

## 2018-02-19 DIAGNOSIS — E876 Hypokalemia: Secondary | ICD-10-CM | POA: Diagnosis not present

## 2018-02-19 DIAGNOSIS — K5900 Constipation, unspecified: Secondary | ICD-10-CM | POA: Diagnosis not present

## 2018-02-19 DIAGNOSIS — D649 Anemia, unspecified: Secondary | ICD-10-CM | POA: Diagnosis not present

## 2018-02-19 DIAGNOSIS — E162 Hypoglycemia, unspecified: Secondary | ICD-10-CM | POA: Diagnosis not present

## 2018-02-19 DIAGNOSIS — I4891 Unspecified atrial fibrillation: Secondary | ICD-10-CM | POA: Diagnosis not present

## 2018-02-26 DIAGNOSIS — E162 Hypoglycemia, unspecified: Secondary | ICD-10-CM | POA: Diagnosis not present

## 2018-02-26 DIAGNOSIS — S22000D Wedge compression fracture of unspecified thoracic vertebra, subsequent encounter for fracture with routine healing: Secondary | ICD-10-CM | POA: Diagnosis not present

## 2018-02-26 DIAGNOSIS — I10 Essential (primary) hypertension: Secondary | ICD-10-CM | POA: Diagnosis not present

## 2018-02-26 DIAGNOSIS — K219 Gastro-esophageal reflux disease without esophagitis: Secondary | ICD-10-CM | POA: Diagnosis not present

## 2018-02-26 DIAGNOSIS — S32000D Wedge compression fracture of unspecified lumbar vertebra, subsequent encounter for fracture with routine healing: Secondary | ICD-10-CM | POA: Diagnosis not present

## 2018-02-26 DIAGNOSIS — H409 Unspecified glaucoma: Secondary | ICD-10-CM | POA: Diagnosis not present

## 2018-02-26 DIAGNOSIS — I4891 Unspecified atrial fibrillation: Secondary | ICD-10-CM | POA: Diagnosis not present

## 2018-02-26 DIAGNOSIS — M6281 Muscle weakness (generalized): Secondary | ICD-10-CM | POA: Diagnosis not present

## 2018-02-26 DIAGNOSIS — G20 Parkinson's disease: Secondary | ICD-10-CM | POA: Diagnosis not present

## 2018-02-26 DIAGNOSIS — E876 Hypokalemia: Secondary | ICD-10-CM | POA: Diagnosis not present

## 2018-02-26 DIAGNOSIS — D649 Anemia, unspecified: Secondary | ICD-10-CM | POA: Diagnosis not present

## 2018-02-26 DIAGNOSIS — K5900 Constipation, unspecified: Secondary | ICD-10-CM | POA: Diagnosis not present

## 2018-03-02 DIAGNOSIS — M545 Low back pain: Secondary | ICD-10-CM | POA: Diagnosis not present

## 2018-03-12 DIAGNOSIS — M6281 Muscle weakness (generalized): Secondary | ICD-10-CM | POA: Diagnosis not present

## 2018-03-12 DIAGNOSIS — K5900 Constipation, unspecified: Secondary | ICD-10-CM | POA: Diagnosis not present

## 2018-03-12 DIAGNOSIS — H409 Unspecified glaucoma: Secondary | ICD-10-CM | POA: Diagnosis not present

## 2018-03-12 DIAGNOSIS — G519 Disorder of facial nerve, unspecified: Secondary | ICD-10-CM | POA: Diagnosis not present

## 2018-03-12 DIAGNOSIS — E876 Hypokalemia: Secondary | ICD-10-CM | POA: Diagnosis not present

## 2018-03-12 DIAGNOSIS — I4891 Unspecified atrial fibrillation: Secondary | ICD-10-CM | POA: Diagnosis not present

## 2018-03-12 DIAGNOSIS — K219 Gastro-esophageal reflux disease without esophagitis: Secondary | ICD-10-CM | POA: Diagnosis not present

## 2018-03-12 DIAGNOSIS — R110 Nausea: Secondary | ICD-10-CM | POA: Diagnosis not present

## 2018-03-12 DIAGNOSIS — I10 Essential (primary) hypertension: Secondary | ICD-10-CM | POA: Diagnosis not present

## 2018-03-12 DIAGNOSIS — S22000D Wedge compression fracture of unspecified thoracic vertebra, subsequent encounter for fracture with routine healing: Secondary | ICD-10-CM | POA: Diagnosis not present

## 2018-03-12 DIAGNOSIS — S32000D Wedge compression fracture of unspecified lumbar vertebra, subsequent encounter for fracture with routine healing: Secondary | ICD-10-CM | POA: Diagnosis not present

## 2018-03-12 DIAGNOSIS — G20 Parkinson's disease: Secondary | ICD-10-CM | POA: Diagnosis not present

## 2018-03-24 DIAGNOSIS — I495 Sick sinus syndrome: Secondary | ICD-10-CM | POA: Diagnosis not present

## 2018-04-09 DIAGNOSIS — G894 Chronic pain syndrome: Secondary | ICD-10-CM | POA: Diagnosis not present

## 2018-04-16 ENCOUNTER — Other Ambulatory Visit: Payer: Self-pay | Admitting: Internal Medicine

## 2018-04-16 DIAGNOSIS — M810 Age-related osteoporosis without current pathological fracture: Secondary | ICD-10-CM

## 2018-04-17 DIAGNOSIS — I4891 Unspecified atrial fibrillation: Secondary | ICD-10-CM | POA: Diagnosis not present

## 2018-04-17 DIAGNOSIS — K219 Gastro-esophageal reflux disease without esophagitis: Secondary | ICD-10-CM | POA: Diagnosis not present

## 2018-04-17 DIAGNOSIS — G20 Parkinson's disease: Secondary | ICD-10-CM | POA: Diagnosis not present

## 2018-04-17 DIAGNOSIS — H409 Unspecified glaucoma: Secondary | ICD-10-CM | POA: Diagnosis not present

## 2018-04-17 DIAGNOSIS — M6281 Muscle weakness (generalized): Secondary | ICD-10-CM | POA: Diagnosis not present

## 2018-04-17 DIAGNOSIS — D649 Anemia, unspecified: Secondary | ICD-10-CM | POA: Diagnosis not present

## 2018-04-17 DIAGNOSIS — S22000D Wedge compression fracture of unspecified thoracic vertebra, subsequent encounter for fracture with routine healing: Secondary | ICD-10-CM | POA: Diagnosis not present

## 2018-04-17 DIAGNOSIS — I10 Essential (primary) hypertension: Secondary | ICD-10-CM | POA: Diagnosis not present

## 2018-04-17 DIAGNOSIS — S32000D Wedge compression fracture of unspecified lumbar vertebra, subsequent encounter for fracture with routine healing: Secondary | ICD-10-CM | POA: Diagnosis not present

## 2018-04-17 DIAGNOSIS — E876 Hypokalemia: Secondary | ICD-10-CM | POA: Diagnosis not present

## 2018-04-17 DIAGNOSIS — K5900 Constipation, unspecified: Secondary | ICD-10-CM | POA: Diagnosis not present

## 2018-04-17 DIAGNOSIS — G519 Disorder of facial nerve, unspecified: Secondary | ICD-10-CM | POA: Diagnosis not present

## 2018-05-24 DIAGNOSIS — I10 Essential (primary) hypertension: Secondary | ICD-10-CM | POA: Diagnosis not present

## 2018-05-24 DIAGNOSIS — G8929 Other chronic pain: Secondary | ICD-10-CM | POA: Diagnosis not present

## 2018-05-24 DIAGNOSIS — E876 Hypokalemia: Secondary | ICD-10-CM | POA: Diagnosis not present

## 2018-05-24 DIAGNOSIS — M6281 Muscle weakness (generalized): Secondary | ICD-10-CM | POA: Diagnosis not present

## 2018-05-24 DIAGNOSIS — K219 Gastro-esophageal reflux disease without esophagitis: Secondary | ICD-10-CM | POA: Diagnosis not present

## 2018-05-24 DIAGNOSIS — G20 Parkinson's disease: Secondary | ICD-10-CM | POA: Diagnosis not present

## 2018-05-24 DIAGNOSIS — G500 Trigeminal neuralgia: Secondary | ICD-10-CM | POA: Diagnosis not present

## 2018-05-24 DIAGNOSIS — H409 Unspecified glaucoma: Secondary | ICD-10-CM | POA: Diagnosis not present

## 2018-05-24 DIAGNOSIS — S32000D Wedge compression fracture of unspecified lumbar vertebra, subsequent encounter for fracture with routine healing: Secondary | ICD-10-CM | POA: Diagnosis not present

## 2018-05-24 DIAGNOSIS — S22000D Wedge compression fracture of unspecified thoracic vertebra, subsequent encounter for fracture with routine healing: Secondary | ICD-10-CM | POA: Diagnosis not present

## 2018-05-24 DIAGNOSIS — I4891 Unspecified atrial fibrillation: Secondary | ICD-10-CM | POA: Diagnosis not present

## 2018-05-24 DIAGNOSIS — K5900 Constipation, unspecified: Secondary | ICD-10-CM | POA: Diagnosis not present

## 2018-05-25 DIAGNOSIS — I4891 Unspecified atrial fibrillation: Secondary | ICD-10-CM | POA: Diagnosis not present

## 2018-05-25 DIAGNOSIS — Z0000 Encounter for general adult medical examination without abnormal findings: Secondary | ICD-10-CM | POA: Diagnosis not present

## 2018-06-06 DIAGNOSIS — R2689 Other abnormalities of gait and mobility: Secondary | ICD-10-CM | POA: Diagnosis not present

## 2018-06-06 DIAGNOSIS — R278 Other lack of coordination: Secondary | ICD-10-CM | POA: Diagnosis not present

## 2018-06-06 DIAGNOSIS — G20 Parkinson's disease: Secondary | ICD-10-CM | POA: Diagnosis not present

## 2018-06-08 ENCOUNTER — Other Ambulatory Visit: Payer: Self-pay | Admitting: Nurse Practitioner

## 2018-06-08 ENCOUNTER — Other Ambulatory Visit: Payer: Self-pay | Admitting: Internal Medicine

## 2018-06-08 DIAGNOSIS — M810 Age-related osteoporosis without current pathological fracture: Secondary | ICD-10-CM

## 2018-06-11 ENCOUNTER — Other Ambulatory Visit: Payer: PPO

## 2018-06-11 DIAGNOSIS — M25559 Pain in unspecified hip: Secondary | ICD-10-CM | POA: Diagnosis not present

## 2018-06-11 DIAGNOSIS — R296 Repeated falls: Secondary | ICD-10-CM | POA: Diagnosis not present

## 2018-06-13 DIAGNOSIS — R278 Other lack of coordination: Secondary | ICD-10-CM | POA: Diagnosis not present

## 2018-06-13 DIAGNOSIS — G20 Parkinson's disease: Secondary | ICD-10-CM | POA: Diagnosis not present

## 2018-06-13 DIAGNOSIS — G894 Chronic pain syndrome: Secondary | ICD-10-CM | POA: Diagnosis not present

## 2018-06-13 DIAGNOSIS — M25551 Pain in right hip: Secondary | ICD-10-CM | POA: Diagnosis not present

## 2018-06-13 DIAGNOSIS — R2689 Other abnormalities of gait and mobility: Secondary | ICD-10-CM | POA: Diagnosis not present

## 2018-06-23 DIAGNOSIS — Z950 Presence of cardiac pacemaker: Secondary | ICD-10-CM | POA: Diagnosis not present

## 2018-06-25 DIAGNOSIS — R296 Repeated falls: Secondary | ICD-10-CM | POA: Diagnosis not present

## 2018-06-25 DIAGNOSIS — K219 Gastro-esophageal reflux disease without esophagitis: Secondary | ICD-10-CM | POA: Diagnosis not present

## 2018-06-25 DIAGNOSIS — M25552 Pain in left hip: Secondary | ICD-10-CM | POA: Diagnosis not present

## 2018-06-25 DIAGNOSIS — I4891 Unspecified atrial fibrillation: Secondary | ICD-10-CM | POA: Diagnosis not present

## 2018-06-25 DIAGNOSIS — M6281 Muscle weakness (generalized): Secondary | ICD-10-CM | POA: Diagnosis not present

## 2018-06-25 DIAGNOSIS — G8929 Other chronic pain: Secondary | ICD-10-CM | POA: Diagnosis not present

## 2018-06-25 DIAGNOSIS — Z7409 Other reduced mobility: Secondary | ICD-10-CM | POA: Diagnosis not present

## 2018-06-25 DIAGNOSIS — K5900 Constipation, unspecified: Secondary | ICD-10-CM | POA: Diagnosis not present

## 2018-06-25 DIAGNOSIS — I10 Essential (primary) hypertension: Secondary | ICD-10-CM | POA: Diagnosis not present

## 2018-06-25 DIAGNOSIS — G20 Parkinson's disease: Secondary | ICD-10-CM | POA: Diagnosis not present

## 2018-06-25 DIAGNOSIS — G500 Trigeminal neuralgia: Secondary | ICD-10-CM | POA: Diagnosis not present

## 2018-06-25 DIAGNOSIS — S22000D Wedge compression fracture of unspecified thoracic vertebra, subsequent encounter for fracture with routine healing: Secondary | ICD-10-CM | POA: Diagnosis not present

## 2018-06-25 DIAGNOSIS — S32000D Wedge compression fracture of unspecified lumbar vertebra, subsequent encounter for fracture with routine healing: Secondary | ICD-10-CM | POA: Diagnosis not present

## 2018-06-25 DIAGNOSIS — M25551 Pain in right hip: Secondary | ICD-10-CM | POA: Diagnosis not present

## 2018-06-27 ENCOUNTER — Telehealth: Payer: Self-pay

## 2018-06-27 NOTE — Telephone Encounter (Signed)
NOTES ON FILE 

## 2018-06-29 DIAGNOSIS — S32502A Unspecified fracture of left pubis, initial encounter for closed fracture: Secondary | ICD-10-CM | POA: Diagnosis not present

## 2018-06-29 DIAGNOSIS — R269 Unspecified abnormalities of gait and mobility: Secondary | ICD-10-CM | POA: Diagnosis not present

## 2018-06-29 DIAGNOSIS — R296 Repeated falls: Secondary | ICD-10-CM | POA: Diagnosis not present

## 2018-07-09 DIAGNOSIS — M25551 Pain in right hip: Secondary | ICD-10-CM | POA: Diagnosis not present

## 2018-07-10 ENCOUNTER — Encounter: Payer: Self-pay | Admitting: Cardiology

## 2018-07-18 ENCOUNTER — Encounter: Payer: Self-pay | Admitting: Internal Medicine

## 2018-07-23 ENCOUNTER — Telehealth: Payer: Self-pay

## 2018-07-23 ENCOUNTER — Telehealth: Payer: Self-pay | Admitting: Cardiology

## 2018-07-23 NOTE — Telephone Encounter (Signed)
Call placed to Pt's son.  Per son Pt is under lockdown at her SNF at this time.  Advised at this time-routine follow up appointments are being cancelled to reduce possible exposure for our patients.  Pt needs to establish care with our practice. Son requests appt be rescheduled d/t length of time to get initial visit. Appt rescheduled.  Advised son our office would attempt to get remote checks sent here until we could physically get Pt in for assessment.  Advised to call office if any needs.  Pt son thanked Engineer, civil (consulting) for assistance.

## 2018-07-23 NOTE — Telephone Encounter (Signed)
Called patient old clinic and requested that she be released in [place] so we can follow her device. LMOVM for the [facility] to release patient.

## 2018-07-24 NOTE — Telephone Encounter (Signed)
Spoke w/ scheduler and she will send message to have pt released in [place].

## 2018-07-25 ENCOUNTER — Encounter: Payer: PPO | Admitting: Internal Medicine

## 2018-07-25 ENCOUNTER — Ambulatory Visit: Payer: PPO | Admitting: Cardiology

## 2018-07-27 NOTE — Telephone Encounter (Signed)
LMOVM for [facility] to return call. Pt needs to be released in [place] so DC can follow pt remotely.

## 2018-08-02 NOTE — Telephone Encounter (Signed)
Spoke w/ pt son and informed him that pt old clinic will not release her information to [facility] Device clinic until patient calls and request it to be done. Pt son said he would work on this.

## 2018-08-07 NOTE — Telephone Encounter (Signed)
Attempted to enroll pt into our clinic. Unable to enroll pt into our clinic.   Spoke w/ pt son and requested that he call pt old clinic and request that she be released to our clinic b/c that is what the clinic wants [place] to do. Pt son verbalized understanding.

## 2018-08-08 DIAGNOSIS — R3914 Feeling of incomplete bladder emptying: Secondary | ICD-10-CM | POA: Diagnosis not present

## 2018-08-08 DIAGNOSIS — R278 Other lack of coordination: Secondary | ICD-10-CM | POA: Diagnosis not present

## 2018-08-08 DIAGNOSIS — G894 Chronic pain syndrome: Secondary | ICD-10-CM | POA: Diagnosis not present

## 2018-08-08 DIAGNOSIS — R2689 Other abnormalities of gait and mobility: Secondary | ICD-10-CM | POA: Diagnosis not present

## 2018-08-08 DIAGNOSIS — N898 Other specified noninflammatory disorders of vagina: Secondary | ICD-10-CM | POA: Diagnosis not present

## 2018-08-08 DIAGNOSIS — R600 Localized edema: Secondary | ICD-10-CM | POA: Diagnosis not present

## 2018-08-08 DIAGNOSIS — G20 Parkinson's disease: Secondary | ICD-10-CM | POA: Diagnosis not present

## 2018-08-10 DIAGNOSIS — Z7409 Other reduced mobility: Secondary | ICD-10-CM | POA: Diagnosis not present

## 2018-08-10 DIAGNOSIS — G20 Parkinson's disease: Secondary | ICD-10-CM | POA: Diagnosis not present

## 2018-08-10 NOTE — Telephone Encounter (Signed)
Attempted to enroll patient again. Pt still pending release. Family / pt must call old clinic to request release.

## 2018-08-11 DIAGNOSIS — Z23 Encounter for immunization: Secondary | ICD-10-CM | POA: Diagnosis not present

## 2018-08-11 DIAGNOSIS — N390 Urinary tract infection, site not specified: Secondary | ICD-10-CM | POA: Diagnosis not present

## 2018-08-11 DIAGNOSIS — R4182 Altered mental status, unspecified: Secondary | ICD-10-CM | POA: Diagnosis not present

## 2018-08-13 NOTE — Telephone Encounter (Signed)
Email sent to [facility] rep to see if they can help force transfer  Gypsy Balsam, NP 08/13/2018 9:43 AM

## 2018-08-14 DIAGNOSIS — Z952 Presence of prosthetic heart valve: Secondary | ICD-10-CM | POA: Diagnosis not present

## 2018-08-14 DIAGNOSIS — I059 Rheumatic mitral valve disease, unspecified: Secondary | ICD-10-CM | POA: Diagnosis not present

## 2018-08-14 DIAGNOSIS — G20 Parkinson's disease: Secondary | ICD-10-CM | POA: Diagnosis not present

## 2018-08-14 DIAGNOSIS — Z5181 Encounter for therapeutic drug level monitoring: Secondary | ICD-10-CM | POA: Diagnosis not present

## 2018-08-14 DIAGNOSIS — F062 Psychotic disorder with delusions due to known physiological condition: Secondary | ICD-10-CM | POA: Diagnosis not present

## 2018-08-14 DIAGNOSIS — Z954 Presence of other heart-valve replacement: Secondary | ICD-10-CM | POA: Diagnosis not present

## 2018-08-14 DIAGNOSIS — I4891 Unspecified atrial fibrillation: Secondary | ICD-10-CM | POA: Diagnosis not present

## 2018-08-14 NOTE — Telephone Encounter (Signed)
Patient still pending transfer.

## 2018-08-16 DIAGNOSIS — G8929 Other chronic pain: Secondary | ICD-10-CM | POA: Diagnosis not present

## 2018-08-16 DIAGNOSIS — G500 Trigeminal neuralgia: Secondary | ICD-10-CM | POA: Diagnosis not present

## 2018-08-16 DIAGNOSIS — M6281 Muscle weakness (generalized): Secondary | ICD-10-CM | POA: Diagnosis not present

## 2018-08-16 DIAGNOSIS — H409 Unspecified glaucoma: Secondary | ICD-10-CM | POA: Diagnosis not present

## 2018-08-16 DIAGNOSIS — I10 Essential (primary) hypertension: Secondary | ICD-10-CM | POA: Diagnosis not present

## 2018-08-16 DIAGNOSIS — M25551 Pain in right hip: Secondary | ICD-10-CM | POA: Diagnosis not present

## 2018-08-16 DIAGNOSIS — I4891 Unspecified atrial fibrillation: Secondary | ICD-10-CM | POA: Diagnosis not present

## 2018-08-16 DIAGNOSIS — Z7409 Other reduced mobility: Secondary | ICD-10-CM | POA: Diagnosis not present

## 2018-08-16 DIAGNOSIS — Z87310 Personal history of (healed) osteoporosis fracture: Secondary | ICD-10-CM | POA: Diagnosis not present

## 2018-08-16 DIAGNOSIS — G20 Parkinson's disease: Secondary | ICD-10-CM | POA: Diagnosis not present

## 2018-08-16 DIAGNOSIS — R296 Repeated falls: Secondary | ICD-10-CM | POA: Diagnosis not present

## 2018-08-16 DIAGNOSIS — K219 Gastro-esophageal reflux disease without esophagitis: Secondary | ICD-10-CM | POA: Diagnosis not present

## 2018-08-16 DIAGNOSIS — K5900 Constipation, unspecified: Secondary | ICD-10-CM | POA: Diagnosis not present

## 2018-08-17 NOTE — Telephone Encounter (Signed)
Transfer completed. Scheduled pt in [place] for 09/14/2018 so pt will have remote transmission for her MD visit on 09/17/2018.

## 2018-09-04 DIAGNOSIS — E7849 Other hyperlipidemia: Secondary | ICD-10-CM | POA: Diagnosis not present

## 2018-09-04 DIAGNOSIS — Z79899 Other long term (current) drug therapy: Secondary | ICD-10-CM | POA: Diagnosis not present

## 2018-09-04 DIAGNOSIS — D649 Anemia, unspecified: Secondary | ICD-10-CM | POA: Diagnosis not present

## 2018-09-04 DIAGNOSIS — E785 Hyperlipidemia, unspecified: Secondary | ICD-10-CM | POA: Diagnosis not present

## 2018-09-04 DIAGNOSIS — E039 Hypothyroidism, unspecified: Secondary | ICD-10-CM | POA: Diagnosis not present

## 2018-09-04 DIAGNOSIS — D519 Vitamin B12 deficiency anemia, unspecified: Secondary | ICD-10-CM | POA: Diagnosis not present

## 2018-09-04 DIAGNOSIS — R262 Difficulty in walking, not elsewhere classified: Secondary | ICD-10-CM | POA: Diagnosis not present

## 2018-09-17 ENCOUNTER — Encounter: Payer: PPO | Admitting: Internal Medicine

## 2018-09-18 ENCOUNTER — Ambulatory Visit (INDEPENDENT_AMBULATORY_CARE_PROVIDER_SITE_OTHER): Payer: PPO | Admitting: *Deleted

## 2018-09-18 DIAGNOSIS — I639 Cerebral infarction, unspecified: Secondary | ICD-10-CM | POA: Diagnosis not present

## 2018-09-19 DIAGNOSIS — G20 Parkinson's disease: Secondary | ICD-10-CM | POA: Diagnosis not present

## 2018-09-19 DIAGNOSIS — R600 Localized edema: Secondary | ICD-10-CM | POA: Diagnosis not present

## 2018-09-19 DIAGNOSIS — K5900 Constipation, unspecified: Secondary | ICD-10-CM | POA: Diagnosis not present

## 2018-09-19 DIAGNOSIS — I4891 Unspecified atrial fibrillation: Secondary | ICD-10-CM | POA: Diagnosis not present

## 2018-09-19 DIAGNOSIS — M6281 Muscle weakness (generalized): Secondary | ICD-10-CM | POA: Diagnosis not present

## 2018-09-19 DIAGNOSIS — Z7409 Other reduced mobility: Secondary | ICD-10-CM | POA: Diagnosis not present

## 2018-09-19 DIAGNOSIS — I10 Essential (primary) hypertension: Secondary | ICD-10-CM | POA: Diagnosis not present

## 2018-09-19 DIAGNOSIS — K219 Gastro-esophageal reflux disease without esophagitis: Secondary | ICD-10-CM | POA: Diagnosis not present

## 2018-09-19 DIAGNOSIS — R296 Repeated falls: Secondary | ICD-10-CM | POA: Diagnosis not present

## 2018-09-19 DIAGNOSIS — Z87310 Personal history of (healed) osteoporosis fracture: Secondary | ICD-10-CM | POA: Diagnosis not present

## 2018-09-19 DIAGNOSIS — G8929 Other chronic pain: Secondary | ICD-10-CM | POA: Diagnosis not present

## 2018-09-19 DIAGNOSIS — G500 Trigeminal neuralgia: Secondary | ICD-10-CM | POA: Diagnosis not present

## 2018-09-19 LAB — CUP PACEART REMOTE DEVICE CHECK
Date Time Interrogation Session: 20200513222825
Implantable Lead Implant Date: 20051022
Implantable Lead Implant Date: 20051022
Implantable Lead Location: 753859
Implantable Lead Location: 753860
Implantable Lead Model: 5076
Implantable Pulse Generator Implant Date: 20150601
Pulse Gen Model: 2240
Pulse Gen Serial Number: 7612285

## 2018-09-20 DIAGNOSIS — R269 Unspecified abnormalities of gait and mobility: Secondary | ICD-10-CM | POA: Diagnosis not present

## 2018-09-20 DIAGNOSIS — R296 Repeated falls: Secondary | ICD-10-CM | POA: Diagnosis not present

## 2018-09-20 DIAGNOSIS — S0081XA Abrasion of other part of head, initial encounter: Secondary | ICD-10-CM | POA: Diagnosis not present

## 2018-09-26 DIAGNOSIS — K0889 Other specified disorders of teeth and supporting structures: Secondary | ICD-10-CM | POA: Diagnosis not present

## 2018-10-02 ENCOUNTER — Other Ambulatory Visit: Payer: Self-pay

## 2018-10-02 NOTE — Progress Notes (Signed)
Remote pacemaker transmission.   

## 2018-10-08 DIAGNOSIS — R296 Repeated falls: Secondary | ICD-10-CM | POA: Diagnosis not present

## 2018-10-08 DIAGNOSIS — Z7409 Other reduced mobility: Secondary | ICD-10-CM | POA: Diagnosis not present

## 2018-10-10 DIAGNOSIS — G894 Chronic pain syndrome: Secondary | ICD-10-CM | POA: Diagnosis not present

## 2018-10-10 DIAGNOSIS — R600 Localized edema: Secondary | ICD-10-CM | POA: Diagnosis not present

## 2018-10-16 DIAGNOSIS — H409 Unspecified glaucoma: Secondary | ICD-10-CM | POA: Diagnosis not present

## 2018-10-16 DIAGNOSIS — I10 Essential (primary) hypertension: Secondary | ICD-10-CM | POA: Diagnosis not present

## 2018-10-16 DIAGNOSIS — I4891 Unspecified atrial fibrillation: Secondary | ICD-10-CM | POA: Diagnosis not present

## 2018-10-16 DIAGNOSIS — R296 Repeated falls: Secondary | ICD-10-CM | POA: Diagnosis not present

## 2018-10-16 DIAGNOSIS — G8929 Other chronic pain: Secondary | ICD-10-CM | POA: Diagnosis not present

## 2018-10-16 DIAGNOSIS — K5900 Constipation, unspecified: Secondary | ICD-10-CM | POA: Diagnosis not present

## 2018-10-16 DIAGNOSIS — K219 Gastro-esophageal reflux disease without esophagitis: Secondary | ICD-10-CM | POA: Diagnosis not present

## 2018-10-16 DIAGNOSIS — G500 Trigeminal neuralgia: Secondary | ICD-10-CM | POA: Diagnosis not present

## 2018-10-16 DIAGNOSIS — Z7409 Other reduced mobility: Secondary | ICD-10-CM | POA: Diagnosis not present

## 2018-10-16 DIAGNOSIS — M6281 Muscle weakness (generalized): Secondary | ICD-10-CM | POA: Diagnosis not present

## 2018-10-16 DIAGNOSIS — Z87310 Personal history of (healed) osteoporosis fracture: Secondary | ICD-10-CM | POA: Diagnosis not present

## 2018-10-16 DIAGNOSIS — G20 Parkinson's disease: Secondary | ICD-10-CM | POA: Diagnosis not present

## 2018-10-22 DIAGNOSIS — K5900 Constipation, unspecified: Secondary | ICD-10-CM | POA: Diagnosis not present

## 2018-11-05 DIAGNOSIS — G500 Trigeminal neuralgia: Secondary | ICD-10-CM | POA: Diagnosis not present

## 2018-11-05 DIAGNOSIS — Z87310 Personal history of (healed) osteoporosis fracture: Secondary | ICD-10-CM | POA: Diagnosis not present

## 2018-11-05 DIAGNOSIS — K5900 Constipation, unspecified: Secondary | ICD-10-CM | POA: Diagnosis not present

## 2018-11-05 DIAGNOSIS — I4891 Unspecified atrial fibrillation: Secondary | ICD-10-CM | POA: Diagnosis not present

## 2018-11-05 DIAGNOSIS — R296 Repeated falls: Secondary | ICD-10-CM | POA: Diagnosis not present

## 2018-11-05 DIAGNOSIS — Z7409 Other reduced mobility: Secondary | ICD-10-CM | POA: Diagnosis not present

## 2018-11-05 DIAGNOSIS — K219 Gastro-esophageal reflux disease without esophagitis: Secondary | ICD-10-CM | POA: Diagnosis not present

## 2018-11-05 DIAGNOSIS — G20 Parkinson's disease: Secondary | ICD-10-CM | POA: Diagnosis not present

## 2018-11-05 DIAGNOSIS — G8929 Other chronic pain: Secondary | ICD-10-CM | POA: Diagnosis not present

## 2018-11-05 DIAGNOSIS — I10 Essential (primary) hypertension: Secondary | ICD-10-CM | POA: Diagnosis not present

## 2018-11-05 DIAGNOSIS — H409 Unspecified glaucoma: Secondary | ICD-10-CM | POA: Diagnosis not present

## 2018-11-05 DIAGNOSIS — M6281 Muscle weakness (generalized): Secondary | ICD-10-CM | POA: Diagnosis not present

## 2018-11-16 DIAGNOSIS — G894 Chronic pain syndrome: Secondary | ICD-10-CM | POA: Diagnosis not present

## 2018-11-16 DIAGNOSIS — R600 Localized edema: Secondary | ICD-10-CM | POA: Diagnosis not present

## 2018-12-10 DIAGNOSIS — E559 Vitamin D deficiency, unspecified: Secondary | ICD-10-CM | POA: Diagnosis not present

## 2018-12-10 DIAGNOSIS — H40219 Acute angle-closure glaucoma, unspecified eye: Secondary | ICD-10-CM | POA: Diagnosis not present

## 2018-12-10 DIAGNOSIS — I10 Essential (primary) hypertension: Secondary | ICD-10-CM | POA: Diagnosis not present

## 2018-12-10 DIAGNOSIS — I4891 Unspecified atrial fibrillation: Secondary | ICD-10-CM | POA: Diagnosis not present

## 2018-12-10 DIAGNOSIS — K219 Gastro-esophageal reflux disease without esophagitis: Secondary | ICD-10-CM | POA: Diagnosis not present

## 2018-12-10 DIAGNOSIS — F329 Major depressive disorder, single episode, unspecified: Secondary | ICD-10-CM | POA: Diagnosis not present

## 2018-12-10 DIAGNOSIS — G8929 Other chronic pain: Secondary | ICD-10-CM | POA: Diagnosis not present

## 2018-12-10 DIAGNOSIS — K5900 Constipation, unspecified: Secondary | ICD-10-CM | POA: Diagnosis not present

## 2018-12-10 DIAGNOSIS — R110 Nausea: Secondary | ICD-10-CM | POA: Diagnosis not present

## 2018-12-11 DIAGNOSIS — G20 Parkinson's disease: Secondary | ICD-10-CM | POA: Diagnosis not present

## 2018-12-11 DIAGNOSIS — R2681 Unsteadiness on feet: Secondary | ICD-10-CM | POA: Diagnosis not present

## 2018-12-11 DIAGNOSIS — R2689 Other abnormalities of gait and mobility: Secondary | ICD-10-CM | POA: Diagnosis not present

## 2018-12-18 ENCOUNTER — Ambulatory Visit (INDEPENDENT_AMBULATORY_CARE_PROVIDER_SITE_OTHER): Payer: PPO | Admitting: *Deleted

## 2018-12-18 DIAGNOSIS — I495 Sick sinus syndrome: Secondary | ICD-10-CM | POA: Diagnosis not present

## 2018-12-20 DIAGNOSIS — M6281 Muscle weakness (generalized): Secondary | ICD-10-CM | POA: Diagnosis not present

## 2018-12-20 DIAGNOSIS — G8929 Other chronic pain: Secondary | ICD-10-CM | POA: Diagnosis not present

## 2018-12-20 DIAGNOSIS — G20 Parkinson's disease: Secondary | ICD-10-CM | POA: Diagnosis not present

## 2018-12-20 DIAGNOSIS — F330 Major depressive disorder, recurrent, mild: Secondary | ICD-10-CM | POA: Diagnosis not present

## 2018-12-20 DIAGNOSIS — K219 Gastro-esophageal reflux disease without esophagitis: Secondary | ICD-10-CM | POA: Diagnosis not present

## 2018-12-20 DIAGNOSIS — H409 Unspecified glaucoma: Secondary | ICD-10-CM | POA: Diagnosis not present

## 2018-12-20 DIAGNOSIS — G500 Trigeminal neuralgia: Secondary | ICD-10-CM | POA: Diagnosis not present

## 2018-12-20 DIAGNOSIS — M810 Age-related osteoporosis without current pathological fracture: Secondary | ICD-10-CM | POA: Diagnosis not present

## 2018-12-20 DIAGNOSIS — K5900 Constipation, unspecified: Secondary | ICD-10-CM | POA: Diagnosis not present

## 2018-12-20 DIAGNOSIS — I4891 Unspecified atrial fibrillation: Secondary | ICD-10-CM | POA: Diagnosis not present

## 2018-12-20 LAB — CUP PACEART REMOTE DEVICE CHECK
Battery Remaining Longevity: 101 mo
Battery Remaining Percentage: 95.5 %
Battery Voltage: 2.96 V
Brady Statistic AP VP Percent: 1 %
Brady Statistic AP VS Percent: 68 %
Brady Statistic AS VP Percent: 1 %
Brady Statistic AS VS Percent: 31 %
Brady Statistic RA Percent Paced: 65 %
Brady Statistic RV Percent Paced: 1 %
Date Time Interrogation Session: 20200812221250
Implantable Lead Implant Date: 20051022
Implantable Lead Implant Date: 20051022
Implantable Lead Location: 753859
Implantable Lead Location: 753860
Implantable Lead Model: 5076
Implantable Pulse Generator Implant Date: 20150601
Lead Channel Impedance Value: 350 Ohm
Lead Channel Impedance Value: 530 Ohm
Lead Channel Pacing Threshold Amplitude: 1 V
Lead Channel Pacing Threshold Amplitude: 3.5 V
Lead Channel Pacing Threshold Pulse Width: 0.4 ms
Lead Channel Pacing Threshold Pulse Width: 0.4 ms
Lead Channel Sensing Intrinsic Amplitude: 0.9 mV
Lead Channel Sensing Intrinsic Amplitude: 7.2 mV
Lead Channel Setting Pacing Amplitude: 2.5 V
Lead Channel Setting Pacing Amplitude: 3.75 V
Lead Channel Setting Pacing Pulse Width: 0.4 ms
Lead Channel Setting Sensing Sensitivity: 2 mV
Pulse Gen Model: 2240
Pulse Gen Serial Number: 7612285

## 2018-12-24 DIAGNOSIS — R296 Repeated falls: Secondary | ICD-10-CM | POA: Diagnosis not present

## 2018-12-27 NOTE — Progress Notes (Signed)
Remote pacemaker transmission.   

## 2019-01-02 ENCOUNTER — Ambulatory Visit: Payer: PPO | Admitting: Cardiovascular Disease

## 2019-01-06 DIAGNOSIS — Z03818 Encounter for observation for suspected exposure to other biological agents ruled out: Secondary | ICD-10-CM | POA: Diagnosis not present

## 2019-01-09 DIAGNOSIS — R2681 Unsteadiness on feet: Secondary | ICD-10-CM | POA: Diagnosis not present

## 2019-01-09 DIAGNOSIS — G20 Parkinson's disease: Secondary | ICD-10-CM | POA: Diagnosis not present

## 2019-01-09 DIAGNOSIS — R2689 Other abnormalities of gait and mobility: Secondary | ICD-10-CM | POA: Diagnosis not present

## 2019-01-16 ENCOUNTER — Ambulatory Visit: Payer: PPO | Admitting: Neurology

## 2019-01-16 ENCOUNTER — Telehealth: Payer: Self-pay

## 2019-01-16 ENCOUNTER — Encounter: Payer: Self-pay | Admitting: Neurology

## 2019-01-16 DIAGNOSIS — Z03818 Encounter for observation for suspected exposure to other biological agents ruled out: Secondary | ICD-10-CM | POA: Diagnosis not present

## 2019-01-16 NOTE — Telephone Encounter (Signed)
Pt did not show for their appt with Dr. Athar today.  

## 2019-01-22 DIAGNOSIS — Z03818 Encounter for observation for suspected exposure to other biological agents ruled out: Secondary | ICD-10-CM | POA: Diagnosis not present

## 2019-01-25 DIAGNOSIS — H409 Unspecified glaucoma: Secondary | ICD-10-CM | POA: Diagnosis not present

## 2019-01-25 DIAGNOSIS — K219 Gastro-esophageal reflux disease without esophagitis: Secondary | ICD-10-CM | POA: Diagnosis not present

## 2019-01-25 DIAGNOSIS — G8929 Other chronic pain: Secondary | ICD-10-CM | POA: Diagnosis not present

## 2019-01-25 DIAGNOSIS — G20 Parkinson's disease: Secondary | ICD-10-CM | POA: Diagnosis not present

## 2019-01-25 DIAGNOSIS — R296 Repeated falls: Secondary | ICD-10-CM | POA: Diagnosis not present

## 2019-01-25 DIAGNOSIS — K5900 Constipation, unspecified: Secondary | ICD-10-CM | POA: Diagnosis not present

## 2019-01-25 DIAGNOSIS — M810 Age-related osteoporosis without current pathological fracture: Secondary | ICD-10-CM | POA: Diagnosis not present

## 2019-01-25 DIAGNOSIS — G500 Trigeminal neuralgia: Secondary | ICD-10-CM | POA: Diagnosis not present

## 2019-01-25 DIAGNOSIS — R110 Nausea: Secondary | ICD-10-CM | POA: Diagnosis not present

## 2019-01-25 DIAGNOSIS — M6281 Muscle weakness (generalized): Secondary | ICD-10-CM | POA: Diagnosis not present

## 2019-01-25 DIAGNOSIS — F330 Major depressive disorder, recurrent, mild: Secondary | ICD-10-CM | POA: Diagnosis not present

## 2019-01-25 DIAGNOSIS — I4891 Unspecified atrial fibrillation: Secondary | ICD-10-CM | POA: Diagnosis not present

## 2019-02-01 DIAGNOSIS — G8929 Other chronic pain: Secondary | ICD-10-CM | POA: Diagnosis not present

## 2019-02-01 DIAGNOSIS — G500 Trigeminal neuralgia: Secondary | ICD-10-CM | POA: Diagnosis not present

## 2019-02-12 ENCOUNTER — Encounter: Payer: Self-pay | Admitting: Cardiology

## 2019-02-12 ENCOUNTER — Other Ambulatory Visit: Payer: Self-pay

## 2019-02-12 ENCOUNTER — Ambulatory Visit (INDEPENDENT_AMBULATORY_CARE_PROVIDER_SITE_OTHER): Payer: PPO | Admitting: Cardiology

## 2019-02-12 VITALS — BP 199/107 | HR 84 | Ht 61.0 in | Wt 108.0 lb

## 2019-02-12 DIAGNOSIS — I495 Sick sinus syndrome: Secondary | ICD-10-CM | POA: Diagnosis not present

## 2019-02-12 DIAGNOSIS — I351 Nonrheumatic aortic (valve) insufficiency: Secondary | ICD-10-CM | POA: Diagnosis not present

## 2019-02-12 DIAGNOSIS — I10 Essential (primary) hypertension: Secondary | ICD-10-CM | POA: Diagnosis not present

## 2019-02-12 DIAGNOSIS — I480 Paroxysmal atrial fibrillation: Secondary | ICD-10-CM | POA: Diagnosis not present

## 2019-02-12 NOTE — Progress Notes (Signed)
Cardiology Office Note:    Date:  02/12/2019   ID:  Teresa Bates, DOB 03/14/1930, MRN [id number]  PCP:  Thurman CoyerGuevara, Jason R, MD  Cardiologist:  No primary care provider on file.  Electrophysiologist:  None   Referring MD: Pearla DubonnetParikh, Amish M, MD   Chief Complaint  Patient presents with  . Atrial Fibrillation    History of Present Illness:    Teresa Bates is a 85 y.o. female with a hx of PAF with tachybarady syndrome s/p PPM, HTN, Parkinsons diseasewho presents for an initial visit.  [place]  [place] past [place].  She has been taking Eliquis, denies any bleeding issues.  Does have intermittent falls related to Parkinson's disease.  Denies any significant injuries.  Recent device interrogation was unremarkable.  She is 65% a paced, 1% V paced.  She denies any dyspnea or chest pain.  Past Medical History:  Diagnosis Date  . A-fib (HCC)    PAROXYSMAL  . Abnormal electrocardiogram   . Anxiety   . Aortic regurgitation   . Arthritis   . Depression   . Essential hypertension 02/05/2018  . Hypertension   . Hypomagnesemia 02/05/2018  . Lumbar compression fracture (HCC) 01/31/2018  . Mitral regurgitation   . Nonrheumatic mitral (valve) insufficiency   . Parkinson's disease [facility](HCC)     Past Surgical History:  Procedure Laterality Date  . PACEMAKER INSERTION  10/2013    Current Medications: Current Meds  Medication Sig  . acetaminophen (TYLENOL) 500 MG tablet Take 1 tablet (500 mg total) by mouth every 6 (six) hours.  Marland Kitchen. apixaban (ELIQUIS) 2.5 MG TABS tablet Take 2.5 mg by mouth 2 (two) times daily.  . calcium-vitamin D (OSCAL WITH D) 500-200 MG-UNIT tablet Take 1 tablet by mouth daily with breakfast.  . citalopram (CELEXA) 20 MG tablet Take 20 mg by mouth at bedtime.  Marland Kitchen. lisinopril (PRINIVIL,ZESTRIL) 2.5 MG tablet Take 2.5 mg by mouth at bedtime.  . magnesium hydroxide (MILK OF MAGNESIA) 400 MG/5ML suspension Take 30 mLs by  mouth daily.  . metoprolol succinate (TOPROL-XL) 25 MG 24 hr tablet Take 12.5 mg by mouth 2 (two) times daily.  Marland Kitchen. omeprazole (PRILOSEC) 20 MG capsule Take 20 mg by mouth daily.  . ondansetron (ZOFRAN) 4 MG tablet Take 1 tablet (4 mg total) by mouth every 6 (six) hours as needed for nausea.  . polyethylene glycol (MIRALAX / GLYCOLAX) packet Take 17 g by mouth 2 (two) times daily.  . pramipexole (MIRAPEX) 0.25 MG tablet Take 0.25 mg by mouth 3 (three) times daily.  . pregabalin (LYRICA) 75 MG capsule Take 75 mg by mouth 2 (two) times daily.  Marland Kitchen. senna-docusate (SENOKOT-S) 8.6-50 MG tablet Take 1 tablet by mouth 2 (two) times daily.  . traMADol (ULTRAM) 50 MG tablet Take 1 tablet (50 mg total) by mouth 3 (three) times daily as needed for moderate pain.  . [DISCONTINUED] bisacodyl (DULCOLAX) 10 MG suppository Place 1 suppository (10 mg total) rectally daily as needed for moderate constipation.  . [DISCONTINUED] oxyCODONE (OXY IR/ROXICODONE) 5 MG immediate release tablet Take 1-2 tablets (5-10 mg total) by mouth every 3 (three) hours as needed for severe pain or breakthrough pain (Take 5 mg q3hprn and then an additioanl 5 mg for breakthrough).     Allergies:   Carbamazepine   Social History   Socioeconomic History  . Marital status: Widowed    Spouse name: Not on file  . Number of children: Not on  file  . Years of education: Not on file  . Highest education level: Not on file  Occupational History  . Not on file  Social Needs  . Financial resource strain: Not on file  . Food insecurity    Worry: Not on file    Inability: Not on file  . Transportation needs    Medical: Not on file    Non-medical: Not on file  Tobacco Use  . Smoking status: Never Smoker  . Smokeless tobacco: Never Used  Substance and Sexual Activity  . Alcohol use: Never    Frequency: Never  . Drug use: Never  . Sexual activity: Not on file  Lifestyle  . Physical activity    Days per week: Not on file    Minutes  per session: Not on file  . Stress: Not on file  Relationships  . Social Herbalist on phone: Not on file    Gets together: Not on file    Attends religious service: Not on file    Active member of club or organization: Not on file    Attends meetings of clubs or organizations: Not on file    Relationship status: Not on file  Other Topics Concern  . Not on file  Social History Narrative  . Not on file     Family History: No pertinent family history of heart disease  ROS:   Please see the history of present illness.     All other systems reviewed and are negative.  EKGs/Labs/Other Studies Reviewed:    The following studies were reviewed today:   EKG:  EKG is ordered today.  The ekg ordered today demonstrates sinus rhythm with frequent atrial paced complexes, frequent PVCs, left bundle branch block  Recent Labs: No results found for requested labs within last 8760 hours.  Recent Lipid Panel No results found for: CHOL, TRIG, HDL, CHOLHDL, VLDL, LDLCALC, LDLDIRECT  Physical Exam:    VS:  BP (!) 199/107   Pulse 84   Ht 5\' 1"  (1.549 m)   Wt 108 lb (49 kg)   SpO2 93%   BMI 20.41 kg/m     Wt Readings from Last 3 Encounters:  02/12/19 108 lb (49 kg)  02/06/18 105 lb 13.1 oz (48 kg)     GEN: in no acute distress HEENT: Normal NECK: No JVD LYMPHATICS: No lymphadenopathy CARDIAC: irregular, no murmurs, rubs, gallops RESPIRATORY:  Clear to auscultation without rales, wheezing or rhonchi  ABDOMEN: Soft, non-tender, non-distended MUSCULOSKELETAL:  No edema; No deformity  SKIN: Warm and dry NEUROLOGIC:  Alert and oriented x 3 PSYCHIATRIC:  Normal affect   ASSESSMENT:    1. Aortic valve insufficiency, etiology of cardiac valve disease unspecified   2. Tachycardia-bradycardia syndrome ([place])   3. Essential hypertension   4. Paroxysmal A-fib (HCC)    PLAN:    In order of problems listed above:  Paroxysmal atrial fibrillation: Currently in sinus rhythm.   Status post pacemaker for tachybrady syndrome.  No recent AF on device interrogation.  CHADS-VASc 4, on Eliquis -Discussed risks/benefits of anticoagulation given intermittent falls.  Patient acknowledges the risk of anticoagulation but wishes to continue on Eliquis to decrease stroke risk.. She is on Eliquis 2.5 mg twice daily given age and weight -Continue metoprolol 12.5 mg twice daily  Tachybrady syndrome: Status post PPM.  Will refer to EP so can follow for device care  AI/MR: reported history, no recent echoes and no reports available.  Will check TTE  HTN: Uncontrolled in clinic today but reports has been normotensive at her facility.  We will asked to check blood pressure daily and call in the next 1 to 2 weeks with measurements.  Continue lisinopril and metoprolol  Return to clinic in 6 months   Medication Adjustments/Labs and Tests Ordered: Current medicines are reviewed at length with the patient today.  Concerns regarding medicines are outlined above.  Orders Placed This Encounter  Procedures  . EKG 12-Lead  . ECHOCARDIOGRAM COMPLETE   No orders of the defined types were placed in this encounter.   Patient Instructions  Medication Instructions:  Continue same mediucations If you need a refill on your cardiac medications before your next appointment, please call your pharmacy.   Lab work: None ordered   Testing/Procedures: Schedule Echo  Follow-Up: At [facility], you and your health needs are our priority.  As part of our continuing mission to provide you with exceptional heart care, we have created designated Provider Care Teams.  These Care Teams include your primary Cardiologist (physician) and Advanced Practice Providers (APPs -  Physician Assistants and Nurse Practitioners) who all work together to provide you with the care you need, when you need it. . Check blood pressure daily call readings to office in 2 weeks . Schedule follow up appointment in 6 months    Call in Jan to schedule April appointment       Signed, Little Ishikawa, MD  02/12/2019 4:56 PM    [facility]

## 2019-02-12 NOTE — Patient Instructions (Signed)
Medication Instructions:  Continue same mediucations If you need a refill on your cardiac medications before your next appointment, please call your pharmacy.   Lab work: None ordered   Testing/Procedures: Schedule Echo  Follow-Up: At [facility], you and your health needs are our priority.  As part of our continuing mission to provide you with exceptional heart care, we have created designated Provider Care Teams.  These Care Teams include your primary Cardiologist (physician) and Advanced Practice Providers (APPs -  Physician Assistants and Nurse Practitioners) who all work together to provide you with the care you need, when you need it. . Check blood pressure daily call readings to office in 2 weeks . Schedule follow up appointment in 6 months   Call in Jan to schedule April appointment

## 2019-02-20 DIAGNOSIS — Z1159 Encounter for screening for other viral diseases: Secondary | ICD-10-CM | POA: Diagnosis not present

## 2019-02-21 ENCOUNTER — Other Ambulatory Visit (HOSPITAL_COMMUNITY): Payer: PPO

## 2019-02-26 ENCOUNTER — Ambulatory Visit (HOSPITAL_COMMUNITY): Payer: PPO | Attending: Cardiology

## 2019-02-26 ENCOUNTER — Ambulatory Visit: Payer: PPO | Admitting: Cardiology

## 2019-02-26 ENCOUNTER — Encounter: Payer: Self-pay | Admitting: Cardiology

## 2019-02-26 ENCOUNTER — Other Ambulatory Visit: Payer: Self-pay

## 2019-02-26 VITALS — BP 184/86 | HR 43 | Ht 61.0 in | Wt 108.0 lb

## 2019-02-26 DIAGNOSIS — I351 Nonrheumatic aortic (valve) insufficiency: Secondary | ICD-10-CM

## 2019-02-26 DIAGNOSIS — I495 Sick sinus syndrome: Secondary | ICD-10-CM

## 2019-02-26 NOTE — Progress Notes (Signed)
Electrophysiology Office Note   Date:  02/26/2019   ID:  Teresa, Bates 11/20/1929, MRN [id number]  PCP:  Rosetta Posner, MD  Cardiologist: Bjorn Pippin Primary Electrophysiologist:  Seferino Oscar Jorja Loa, MD    Chief Complaint: pacemaker   History of Present Illness: Teresa Bates is a 85 y.o. female who is being seen today for the evaluation of pacemaker at the request of Nathaniel Man. Presenting today for electrophysiology evaluation.  She has a history of paroxysmal atrial fibrillation, tachybradycardia syndrome status post [facility] pacemaker, hypertension, Parkinson's disease.  She presents today for an initial visit after moving from [place].  She is currently on Eliquis.  She has not had any bleeding issues.  Today, she denies symptoms of palpitations, chest pain, shortness of breath, orthopnea, PND, lower extremity edema, claudication, dizziness, presyncope, syncope, bleeding, or neurologic sequela. The patient is tolerating medications without difficulties.    Past Medical History:  Diagnosis Date  . A-fib (HCC)    PAROXYSMAL  . Abnormal electrocardiogram   . Anxiety   . Aortic regurgitation   . Arthritis   . Depression   . Essential hypertension 02/05/2018  . Hypertension   . Hypomagnesemia 02/05/2018  . Lumbar compression fracture (HCC) 01/31/2018  . Mitral regurgitation   . Nonrheumatic mitral (valve) insufficiency   . Parkinson's disease [facility])    Past Surgical History:  Procedure Laterality Date  . PACEMAKER INSERTION  10/2013     Current Outpatient Medications  Medication Sig Dispense Refill  . acetaminophen (TYLENOL) 500 MG tablet Take 500 mg by mouth every 6 (six) hours as needed.    Marland Kitchen apixaban (ELIQUIS) 2.5 MG TABS tablet Take 2.5 mg by mouth 2 (two) times daily.    . calcium-vitamin D (OSCAL WITH D) 500-200 MG-UNIT tablet Take 1 tablet by mouth daily with breakfast. 30 tablet 0  . Cholecalciferol (VITAMIN D3) 50 MCG (2000 UT) TABS Take 50 mcg by  mouth daily.    . citalopram (CELEXA) 20 MG tablet Take 20 mg by mouth at bedtime.    . metoprolol succinate (TOPROL-XL) 25 MG 24 hr tablet Take 12.5 mg by mouth 2 (two) times daily.    Marland Kitchen omeprazole (PRILOSEC) 20 MG capsule Take 20 mg by mouth daily.    . ondansetron (ZOFRAN) 4 MG tablet Take 1 tablet (4 mg total) by mouth every 6 (six) hours as needed for nausea. 20 tablet 0  . pramipexole (MIRAPEX) 0.25 MG tablet Take 0.25 mg by mouth 3 (three) times daily.    . pregabalin (LYRICA) 75 MG capsule Take 100 mg by mouth every 8 (eight) hours.     . senna-docusate (SENOKOT-S) 8.6-50 MG tablet Take 1 tablet by mouth 2 (two) times daily.    . traMADol (ULTRAM) 50 MG tablet Take 1 tablet (50 mg total) by mouth 3 (three) times daily as needed for moderate pain. 10 tablet 0   No current facility-administered medications for this visit.     Allergies:   Carbamazepine   Social History:  The patient  reports that she has never smoked. She has never used smokeless tobacco. She reports that she does not drink alcohol or use drugs.   Family History:  The patient's family history includes Heart attack in her father; Heart failure in her mother.    ROS:  Please see the history of present illness.   Otherwise, review of systems is positive for none.   All other systems are reviewed and negative.    PHYSICAL EXAM: VS:  BP (!) 184/86   Pulse (!) 43   Ht 5\' 1"  (1.549 m)   Wt 108 lb (49 kg)   SpO2 93%   BMI 20.41 kg/m  , BMI Body mass index is 20.41 kg/m. GEN: Well nourished, well developed, in no acute distress  HEENT: normal  Neck: no JVD, carotid bruits, or masses Cardiac: iRRR; no murmurs, rubs, or gallops,no edema  Respiratory:  clear to auscultation bilaterally, normal work of breathing GI: soft, nontender, nondistended, + BS MS: no deformity or atrophy  Skin: warm and dry, device pocket is well healed Neuro:  Strength and sensation are intact Psych: euthymic mood, full affect  EKG:  EKG  is not ordered today. Personal review of the ekg ordered 02/12/19 shows that his rhythm, intermittent atrial paced, left bundle branch block  Device interrogation is reviewed today in detail.  See PaceArt for details.   Recent Labs: No results found for requested labs within last 8760 hours.    Lipid Panel  No results found for: CHOL, TRIG, HDL, CHOLHDL, VLDL, LDLCALC, LDLDIRECT   Wt Readings from Last 3 Encounters:  02/26/19 108 lb (49 kg)  02/12/19 108 lb (49 kg)  02/06/18 105 lb 13.1 oz (48 kg)      Other studies Reviewed: Additional studies/ records that were reviewed today include: epic notes   ASSESSMENT AND PLAN:  1.  Tachybradycardia syndrome: Status post [employer] Jude dual-chamber pacemaker.  Device functioning appropriately.  No changes.  We Jairon Ripberger have her enrolled in remote monitoring clinic.  2.  Paroxysmal atrial fibrillation: Continue Eliquis.  Currently on metoprolol.  3.  Hypertension: It is unclear to me how long it has been elevated.  I have asked that her facility check her blood pressures daily for the next 2 weeks.  Blood pressure medications may change based on her echocardiogram.  Significantly elevated today.    Current medicines are reviewed at length with the patient today.   The patient does not have concerns regarding her medicines.  The following changes were made today:  none  Labs/ tests ordered today include:  No orders of the defined types were placed in this encounter.  Is discussed with referring cardiologist  Disposition:   FU with Tyia Binford Eldred 1 year  Signed, Camella Seim Meredith Leeds, MD  02/26/2019 4:38 PM     [place] [address] [place] [address] [phone] (office) [phone] (fax)

## 2019-02-26 NOTE — Patient Instructions (Signed)
Medication Instructions:  Your physician recommends that you continue on your current medications as directed. Please refer to the Current Medication list given to you today.  *If you need a refill on your cardiac medications before your next appointment, please call your pharmacy*  Labwork: None ordered  Testing/Procedures: None ordered  Follow-Up: Remote monitoring is used to monitor your Pacemaker or ICD from home. This monitoring reduces the number of office visits required to check your device to one time per year. It allows [place] to keep an eye on the functioning of your device to ensure it is working properly. You are scheduled for a device check from home on 03/20/19. You may send your transmission at any time that day. If you have a wireless device, the transmission will be sent automatically. After your physician reviews your transmission, you will receive a postcard with your next transmission date.  At [facility], you and your health needs are our priority.  As part of our continuing mission to provide you with exceptional heart care, we have created designated Provider Care Teams.  These Care Teams include your primary Cardiologist (physician) and Advanced Practice Providers (APPs -  Physician Assistants and Nurse Practitioners) who all work together to provide you with the care you need, when you need it.  You will need a follow up appointment in 12 months.  Please call our office 2 months in advance to schedule this appointment.  You may see Dr Curt Bears or one of the following Advanced Practice Providers on your designated Care Team:    Chanetta Marshall, NP  Tommye Standard, PA-C  Oda Kilts, [place]  Thank you for choosing [facility]!!   Trinidad Curet, RN [phone]

## 2019-02-27 ENCOUNTER — Other Ambulatory Visit: Payer: Self-pay

## 2019-02-27 DIAGNOSIS — Z79899 Other long term (current) drug therapy: Secondary | ICD-10-CM

## 2019-02-27 MED ORDER — LISINOPRIL 5 MG PO TABS: 5.0000 mg | ORAL_TABLET | Freq: Every day | ORAL | 3 refills | Status: AC

## 2019-02-27 NOTE — Progress Notes (Signed)
Will do, thanks Will! Gerald Stabs

## 2019-02-28 ENCOUNTER — Other Ambulatory Visit: Payer: Self-pay

## 2019-02-28 ENCOUNTER — Ambulatory Visit: Payer: PPO | Admitting: Neurology

## 2019-02-28 ENCOUNTER — Encounter: Payer: Self-pay | Admitting: Neurology

## 2019-02-28 VITALS — BP 118/60 | HR 75 | Temp 98.0°F

## 2019-02-28 DIAGNOSIS — G20 Parkinson's disease: Secondary | ICD-10-CM

## 2019-02-28 DIAGNOSIS — M6281 Muscle weakness (generalized): Secondary | ICD-10-CM | POA: Diagnosis not present

## 2019-02-28 DIAGNOSIS — Z9181 History of falling: Secondary | ICD-10-CM | POA: Diagnosis not present

## 2019-02-28 DIAGNOSIS — G500 Trigeminal neuralgia: Secondary | ICD-10-CM | POA: Diagnosis not present

## 2019-02-28 DIAGNOSIS — I4891 Unspecified atrial fibrillation: Secondary | ICD-10-CM | POA: Diagnosis not present

## 2019-02-28 DIAGNOSIS — R296 Repeated falls: Secondary | ICD-10-CM | POA: Diagnosis not present

## 2019-02-28 DIAGNOSIS — R2689 Other abnormalities of gait and mobility: Secondary | ICD-10-CM | POA: Diagnosis not present

## 2019-02-28 NOTE — Progress Notes (Signed)
Subjective:    Patient ID: Zamirah Denny is a 85 y.o. female.  HPI     Star Age, MD, PhD [facility] [address] [place], [place] [address]  Dear Hinton Dyer,   I saw your patient, Kimaria Struthers, upon your kind request in my neurologic clinic today for initial consultation of her parkinsonism.  The patient is accompanied by her son and Bobbye Riggs from interpretation services today.  She missed an appointment on 01/16/2019.  As you know, Ms. Agner is an 84 year old right-handed woman with an underlying complex medical history of hypertension, arthritis, anxiety, history of A. fib, aortic regurgitation, history of lumbar compression fracture, mitral regurgitation, anemia, facial pain, hypertension, glaucoma and reflux disease, who was diagnosed with Parkinson's disease some 4 years ago.  She was in [place] at the time.  She had left foot tremor as one of her for symptoms.  Her history is primarily provided by her son.  He reports that she was started on Mirapex.  They had recently reduced it because of hallucinations.  She has fallen.  She fell in August, despite using her walker.  She has had some physical therapy.  She resides at [facility].  Her son also reports that she was diagnosed with trigeminal neuralgia.  She has been on Lyrica for this.  She is no longer taking oxycodone and has tramadol as needed.  She is currently on Mirapex 0.25 mg 3 times daily.  She has had balance problems, changes in her posture, and has developed lower extremity swelling.  She fell twice in July.  She has had some elevated blood pressure values according to her son.  Her Past Medical History Is Significant For: Past Medical History:  Diagnosis Date  . A-fib (HCC)    PAROXYSMAL  . Abnormal electrocardiogram   . Anxiety   . Aortic regurgitation   . Arthritis   . Depression   . Essential hypertension 02/05/2018  . Hypertension   . Hypomagnesemia 02/05/2018  . Lumbar  compression fracture ([place]) 01/31/2018  . Mitral regurgitation   . Nonrheumatic mitral (valve) insufficiency   . Parkinson's disease ([place])     Her Past Surgical History Is Significant For: Past Surgical History:  Procedure Laterality Date  . PACEMAKER INSERTION  10/2013    Her Family History Is Significant For: Family History  Problem Relation Age of Onset  . Heart failure Mother   . Heart attack Father     Her Social History Is Significant For: Social History   Socioeconomic History  . Marital status: Widowed    Spouse name: Not on file  . Number of children: Not on file  . Years of education: Not on file  . Highest education level: Not on file  Occupational History  . Not on file  Social Needs  . Financial resource strain: Not on file  . Food insecurity    Worry: Not on file    Inability: Not on file  . Transportation needs    Medical: Not on file    Non-medical: Not on file  Tobacco Use  . Smoking status: Never Smoker  . Smokeless tobacco: Never Used  Substance and Sexual Activity  . Alcohol use: Never    Frequency: Never  . Drug use: Never  . Sexual activity: Not on file  Lifestyle  . Physical activity    Days per week: Not on file    Minutes per session: Not on file  . Stress: Not on file  Relationships  . Social Musicianconnections    Talks on phone: Not on file    Gets together: Not on file    Attends religious service: Not on file    Active member of club or organization: Not on file    Attends meetings of clubs or organizations: Not on file    Relationship status: Not on file  Other Topics Concern  . Not on file  Social History Narrative  . Not on file    Her Allergies Are:  Allergies  Allergen Reactions  . Carbamazepine Rash  :   Her Current Medications Are:  Outpatient Encounter Medications as of 02/28/2019  Medication Sig  . acetaminophen (TYLENOL) 500 MG tablet Take 500 mg by mouth every 6 (six) hours as needed.  Marland Kitchen. apixaban (ELIQUIS) 2.5  MG TABS tablet Take 2.5 mg by mouth 2 (two) times daily.  . calcium-vitamin D (OSCAL WITH D) 500-200 MG-UNIT tablet Take 1 tablet by mouth daily with breakfast.  . Cholecalciferol (VITAMIN D3) 50 MCG (2000 UT) TABS Take 50 mcg by mouth daily.  . citalopram (CELEXA) 20 MG tablet Take 20 mg by mouth at bedtime.  Marland Kitchen. lisinopril (ZESTRIL) 5 MG tablet Take 1 tablet (5 mg total) by mouth daily.  . metoprolol succinate (TOPROL-XL) 25 MG 24 hr tablet Take 12.5 mg by mouth 2 (two) times daily.  Marland Kitchen. omeprazole (PRILOSEC) 20 MG capsule Take 20 mg by mouth daily.  . ondansetron (ZOFRAN) 4 MG tablet Take 1 tablet (4 mg total) by mouth every 6 (six) hours as needed for nausea.  . pramipexole (MIRAPEX) 0.25 MG tablet Take 0.25 mg by mouth 3 (three) times daily.  . pregabalin (LYRICA) 75 MG capsule Take 100 mg by mouth every 8 (eight) hours.   . senna-docusate (SENOKOT-S) 8.6-50 MG tablet Take 1 tablet by mouth 2 (two) times daily.  . traMADol (ULTRAM) 50 MG tablet Take 1 tablet (50 mg total) by mouth 3 (three) times daily as needed for moderate pain.   No facility-administered encounter medications on file as of 02/28/2019.   :   Review of Systems:  Out of a complete 14 point review of systems, all are reviewed and negative with the exception of these symptoms as listed below:  Review of Systems  Neurological:       Pt presents today to discuss if she has PD. Pt is unsure if she has ever tried C/L. She is on mirapex. Pt reports a couple falls this year. She is complaining that her balance is bad. She is complaining of leg twitching at night.    Objective:  Neurological Exam  Physical Exam Physical Examination:   Vitals:   02/28/19 1436  BP: 118/60  Pulse: 75  Temp: 98 F (36.7 C)   General Examination: The patient is a very pleasant 85 y.o. female in no acute distress. She appears Frail and deconditioned, significant upper back kyphosis noted, questionable scoliosis.  She is in her transport  chair.  She is well-groomed.   HEENT: Normocephalic, atraumatic, pupils are equal, round and reactive to light.Extraocular tracking shows saccadic breakdown.  Funduscopic exam is not possible, she has dense bilateral cataracts.  Face is symmetric with mild facial masking, mild to moderate nuchal rigidity noted.  Shoulder height appears to be equal.  Tongue protrudes centrally in palate elevates symmetrically.  Airway examination reveals moderate mouth dryness, no sialorrhea.  No orofacial dyskinesias. Mild hypophonia, no obvious dysarthria, Spanish-speaking, able to answer questions appropriately.  Chest: Clear to auscultation  without wheezing, rhonchi or crackles noted.  Heart: S1+S2+0, regular and normal without murmurs, rubs or gallops noted.   Abdomen: Soft, non-tender and non-distended with normal bowel sounds appreciated on auscultation.  Extremities: There is 1+ pitting edema in the distal lower extremities bilaterally, Mostly around the ankles.  Skin: Warm and dry without trophic changes noted.  Musculoskeletal: exam reveals Arthritic changes in both hands.  Neurologically:  Mental status: The patient is awake, alert and oriented in all 4 spheres. Her immediate and remote memory, attention, language skills and fund of knowledge are Mildly impaired.  Her memory is somewhat difficult to assess, her history is primarily provided by her son, she is somewhat slow in responding, shorter sentences. Cranial nerves II - XII are as described above under HEENT exam.  On 02/28/2019: On Archimedes spiral drawing she has coarse difficulty with the left hand, no significant trembling or difficulty with the right hand which is her dominant hand, handwriting is small and difficult to read, nearly illegible.  Motor exam: Thin bulk, global strength of approximately 4 out of 5, no obvious resting tremor currently, increased left upper extremity tone but otherwise no obvious cogwheeling.  Fine motor skills  are moderately impaired with finger taps and hand movements and foot taps on the left and mildly impaired on the right.  She did not bring her walker today.  I did not feel it was safe to stand and walker as she has had recurrent falls despite using a walker. Sensory exam: intact to light touch.    Assessment and Plan:  Assessment and Plan:  In summary, Cailin Gebel is a very pleasant 85 y.o.-year old female with an underlying complex medical history of hypertension, arthritis, anxiety, history of A. fib, aortic regurgitation, history of lumbar compression fracture, mitral regurgitation, anemia, facial pain, hypertension, glaucoma and reflux disease, who  Presents for evaluation of her prior diagnosis of PD.  She was diagnosed with Parkinson's disease some 4 years ago, she was residing in [place] at the time.  I do not have any prior neurology records available for my review today.  We will request them from her [place] neurologist.  She has been on Mirapex.  She has not been on Sinemet per son.  For now, I would like to suggest that she maintain at the current dose of Mirapex 0.25 mg 3 times daily.  I explained to the son that certain side effects have to be weighed against benefit.  Unfortunately, physical deconditioning and progression of balance issues have to be considered.  She has developed some visual hallucinations.  She has reduction in the pramipexoleBecause of hallucinations.  In addition, I noted that she has lower extremity swelling which can be seen with dopamine agonists.  I would recommend that she have ongoing physical therapy if possible.  She is advised not to use her walker by herself, I do not think she would be deemed safe to use her walker independently as she has fallen despite using a walker.She has had some elevated blood pressure values according to the son.  Please continue to monitor for blood pressure elevation. Her blood pressure was low normal today in our clinic.  I  recommended a routine follow-up with a nurse practitioner in 6 months, sooner if needed.  I answered all their questions today and the patient and her son were in agreement. Her son asked me whether she should have an increase in her patient for her trigeminal neuralgia.  I would favor to continue  with the current medication and not increase it, primarily for fear of side effects. Thank you very much for allowing me to participate in the care of this nice patient. If I can be of any further assistance to you please do not hesitate to call me at [phone].  Sincerely,   Huston Foley, MD, PhD

## 2019-02-28 NOTE — Patient Instructions (Signed)
It was nice to meet you today.  We will try to get records from your Neurologist in [place].  I would recommend you continue With your current medication for Parkinson's disease which is Mirapex 0.25 mg 3 times daily.  Increasing the medication can result in side effects such as low blood pressure, hallucinations, nausea, lower extremity swelling, you already have swelling that is noticeable in both feet and both ankles.  I would not recommend increasing the dose at this time. You have fallen despite using your walker.  I would not recommend that you walk with a walker by yourself, only with additional assistance. Please follow-up in 6 months with one of our nurse practitioners routinely, sooner if needed.

## 2019-03-04 DIAGNOSIS — G8929 Other chronic pain: Secondary | ICD-10-CM | POA: Diagnosis not present

## 2019-03-06 DIAGNOSIS — R509 Fever, unspecified: Secondary | ICD-10-CM | POA: Diagnosis not present

## 2019-03-06 DIAGNOSIS — R05 Cough: Secondary | ICD-10-CM | POA: Diagnosis not present

## 2019-03-07 DIAGNOSIS — Z79899 Other long term (current) drug therapy: Secondary | ICD-10-CM | POA: Diagnosis not present

## 2019-03-12 DIAGNOSIS — Z03818 Encounter for observation for suspected exposure to other biological agents ruled out: Secondary | ICD-10-CM | POA: Diagnosis not present

## 2019-03-13 ENCOUNTER — Encounter: Payer: Self-pay | Admitting: Cardiology

## 2019-03-13 ENCOUNTER — Ambulatory Visit (INDEPENDENT_AMBULATORY_CARE_PROVIDER_SITE_OTHER): Payer: PPO | Admitting: Cardiology

## 2019-03-13 ENCOUNTER — Other Ambulatory Visit: Payer: Self-pay

## 2019-03-13 VITALS — BP 147/88 | HR 76 | Ht 60.0 in | Wt 107.0 lb

## 2019-03-13 DIAGNOSIS — I480 Paroxysmal atrial fibrillation: Secondary | ICD-10-CM

## 2019-03-13 DIAGNOSIS — I10 Essential (primary) hypertension: Secondary | ICD-10-CM

## 2019-03-13 DIAGNOSIS — I495 Sick sinus syndrome: Secondary | ICD-10-CM

## 2019-03-13 DIAGNOSIS — I5022 Chronic systolic (congestive) heart failure: Secondary | ICD-10-CM

## 2019-03-13 NOTE — Progress Notes (Signed)
Cardiology Office Note:    Date:  03/13/2019   ID:  Teresa Bates, DOB 1929/12/07, MRN [id number]  PCP:  Rica Koyanagi, MD  Cardiologist:  No primary care provider on file.  Electrophysiologist:  None   Referring MD: Rica Koyanagi, MD   Chief Complaint  Patient presents with  . Congestive Heart Failure    History of Present Illness:    Teresa Bates is a 85 y.o. female with a hx of PAF with tachybarady syndrome s/p PPM, HTN, Parkinsons disease who presents for a follow-up visit.  She was initially seen in clinic on 02/12/2019.  She had previously followed with a cardiologist in [place] and this past year moved to [place].  At her last visit, she was referred to EP to follow-up for her devic.  She saw Dr. Curt Bears and enrolled in remote monitoring.    Her blood pressure was noted to be significantly elevated.  She was started on lisinopril 5 mg daily in addition to Toprol-XL 25 mg daily.  TTE was done and showed reduced systolic function, 40 to 31%.  There is abnormal septal motion consistent with left bundle branch block.  Mild to moderate TR.  Mild MR.  Reports that her mobility has declined due to her Parkinson's disease.  She currently is not able to walk by herself but continues to do physical therapy.  She denies any chest pain or shortness of breath.  Has reported some lower extremity edema but not currently.  Denies any lightheadedness or syncope.  Continues to take Eliquis, denies any bleeding issues.  Past Medical History:  Diagnosis Date  . A-fib (HCC)    PAROXYSMAL  . Abnormal electrocardiogram   . Anxiety   . Aortic regurgitation   . Arthritis   . Depression   . Essential hypertension 02/05/2018  . Hypertension   . Hypomagnesemia 02/05/2018  . Lumbar compression fracture ([place]) 01/31/2018  . Mitral regurgitation   . Nonrheumatic mitral (valve) insufficiency   . Parkinson's disease [facility])     Past Surgical History:  Procedure Laterality Date  . PACEMAKER  INSERTION  10/2013    Current Medications: Current Meds  Medication Sig  . acetaminophen (TYLENOL) 500 MG tablet Take 500 mg by mouth every 6 (six) hours as needed.  Marland Kitchen apixaban (ELIQUIS) 2.5 MG TABS tablet Take 2.5 mg by mouth 2 (two) times daily.  . calcium-vitamin D (OSCAL WITH D) 500-200 MG-UNIT tablet Take 1 tablet by mouth daily with breakfast.  . Cholecalciferol (VITAMIN D3) 50 MCG (2000 UT) TABS Take 50 mcg by mouth daily.  . citalopram (CELEXA) 20 MG tablet Take 20 mg by mouth at bedtime.  Marland Kitchen lisinopril (ZESTRIL) 5 MG tablet Take 1 tablet (5 mg total) by mouth daily.  Marland Kitchen omeprazole (PRILOSEC) 20 MG capsule Take 20 mg by mouth daily.  . ondansetron (ZOFRAN) 4 MG tablet Take 1 tablet (4 mg total) by mouth every 6 (six) hours as needed for nausea.  . pramipexole (MIRAPEX) 0.25 MG tablet Take 0.25 mg by mouth 3 (three) times daily.  . pregabalin (LYRICA) 75 MG capsule Take 100 mg by mouth every 8 (eight) hours.   . senna-docusate (SENOKOT-S) 8.6-50 MG tablet Take 1 tablet by mouth 2 (two) times daily.  . traMADol (ULTRAM) 50 MG tablet Take 1 tablet (50 mg total) by mouth 3 (three) times daily as needed for moderate pain.  . [DISCONTINUED] metoprolol succinate (TOPROL-XL) 25 MG 24 hr tablet Take 12.5 mg by mouth 2 (two) times daily.  Allergies:   Carbamazepine   Social History   Socioeconomic History  . Marital status: Widowed    Spouse name: Not on file  . Number of children: Not on file  . Years of education: Not on file  . Highest education level: Not on file  Occupational History  . Not on file  Social Needs  . Financial resource strain: Not on file  . Food insecurity    Worry: Not on file    Inability: Not on file  . Transportation needs    Medical: Not on file    Non-medical: Not on file  Tobacco Use  . Smoking status: Never Smoker  . Smokeless tobacco: Never Used  Substance and Sexual Activity  . Alcohol use: Never    Frequency: Never  . Drug use: Never  .  Sexual activity: Not on file  Lifestyle  . Physical activity    Days per week: Not on file    Minutes per session: Not on file  . Stress: Not on file  Relationships  . Social Musicianconnections    Talks on phone: Not on file    Gets together: Not on file    Attends religious service: Not on file    Active member of club or organization: Not on file    Attends meetings of clubs or organizations: Not on file    Relationship status: Not on file  Other Topics Concern  . Not on file  Social History Narrative  . Not on file     Family History: The patient's family history includes Heart attack in her father; Heart failure in her mother.  ROS:   Please see the history of present illness.     All other systems reviewed and are negative.  EKGs/Labs/Other Studies Reviewed:    The following studies were reviewed today:   EKG:  EKG is not ordered today.    Recent Labs: No results found for requested labs within last 8760 hours.  Recent Lipid Panel No results found for: CHOL, TRIG, HDL, CHOLHDL, VLDL, LDLCALC, LDLDIRECT  Physical Exam:    VS:  BP (!) 147/88   Pulse 76   Ht 5' (1.524 m)   Wt 107 lb (48.5 kg)   SpO2 96%   BMI 20.90 kg/m     Wt Readings from Last 3 Encounters:  03/13/19 107 lb (48.5 kg)  02/26/19 108 lb (49 kg)  02/12/19 108 lb (49 kg)     GEN:  in no acute distress HEENT: Normal NECK: No JVD LYMPHATICS: No lymphadenopathy CARDIAC: RRR, no murmurs RESPIRATORY:  Clear to auscultation without rales, wheezing or rhonchi  ABDOMEN: Soft, non-tender, non-distended MUSCULOSKELETAL:  No edema; No deformity  SKIN: Warm and dry NEUROLOGIC:  Alert and oriented x 3 PSYCHIATRIC:  Normal affect   ASSESSMENT:    1. Chronic systolic heart failure (HCC)   2. Essential hypertension   3. Paroxysmal A-fib (HCC)   4. Tachy-brady syndrome (HCC)    PLAN:    Chronic systolic heart failure: EF 40 to 45%.  She appears compensated, currently euvolemic.  Unclear etiology.   Could be related to frequent RV pacing.  No anginal symptoms to suggest CAD.  Given her age and fragility, she is not a good candidate for heart catheterization.  Discussed with patient and her son in clinic today, and they agreed that they would not want to pursue invasive procedures at this time.  Managing medically, is currently on lisinopril 5 mg daily and Toprol 12.5  mg twice daily.  Will increase Toprol to 25 mg twice daily, and continue lisinopril.  Hypertension: Blood pressures have been over 200s, improved since starting lisinopril.  BP was 147/88 today.  Will increase metoprolol as above.  Will check BMET today, as I have not checked labs since starting lisinopril.  We will ask her facility to check her blood pressure daily and call in the next 2 weeks with results.  Paroxysmal atrial fibrillation: Currently in sinus rhythm.  Status post pacemaker for tachybrady syndrome.  No recent AF on device interrogation.  CHADS-VASc 5, on Eliquis -Discussed risks/benefits of anticoagulation given intermittent falls.  Patient acknowledges the risk of anticoagulation but wishes to continue on Eliquis to decrease stroke risk.. She is on Eliquis 2.5 mg twice daily given age and weight -Continue metoprolol 25 mg twice daily  Tachybrady syndrome: Status post PPM.  Follows with EP  RTC in 3 months  Medication Adjustments/Labs and Tests Ordered: Current medicines are reviewed at length with the patient today.  Concerns regarding medicines are outlined above.  Orders Placed This Encounter  Procedures  . Basic metabolic panel   No orders of the defined types were placed in this encounter.   Patient Instructions  Medication Instructions:  Increase Toprol to 25 mg twice a day   Lab Work: Bmet today   Testing/Procedures: None ordered  Follow-Up: At [facility], you and your health needs are our priority.  As part of our continuing mission to provide you with exceptional heart care, we have  created designated Provider Care Teams.  These Care Teams include your primary Cardiologist (physician) and Advanced Practice Providers (APPs -  Physician Assistants and Nurse Practitioners) who all work together to provide you with the care you need, when you need it.  Your next appointment:  3 months   Thursday 06/13/19 at 2:20 pm   The format for your next appointment:  Office   Provider:  Dr.Arfa Lamarca    Check blood pressure daily for 3 weeks   Call office to report readings     Signed, Little Ishikawa, MD  03/13/2019 6:04 PM    [facility]

## 2019-03-13 NOTE — Patient Instructions (Signed)
Medication Instructions:  Increase Toprol to 25 mg twice a day   Lab Work: Bmet today   Testing/Procedures: None ordered  Follow-Up: At [facility], you and your health needs are our priority.  As part of our continuing mission to provide you with exceptional heart care, we have created designated Provider Care Teams.  These Care Teams include your primary Cardiologist (physician) and Advanced Practice Providers (APPs -  Physician Assistants and Nurse Practitioners) who all work together to provide you with the care you need, when you need it.  Your next appointment:  3 months   Thursday 06/13/19 at 2:20 pm   The format for your next appointment:  Office   Provider:  Dr.Schumann    Check blood pressure daily for 3 weeks   Call office to report readings

## 2019-03-14 DIAGNOSIS — R05 Cough: Secondary | ICD-10-CM | POA: Diagnosis not present

## 2019-03-14 LAB — BASIC METABOLIC PANEL
BUN/Creatinine Ratio: 31 — ABNORMAL HIGH (ref 12–28)
BUN: 24 mg/dL (ref 8–27)
CO2: 22 mmol/L (ref 20–29)
Calcium: 9.4 mg/dL (ref 8.7–10.3)
Chloride: 104 mmol/L (ref 96–106)
Creatinine, Ser: 0.78 mg/dL (ref 0.57–1.00)
GFR calc Af Amer: 79 mL/min/{1.73_m2} (ref >59)
GFR calc non Af Amer: 69 mL/min/{1.73_m2} (ref >59)
Glucose: 87 mg/dL (ref 65–99)
Potassium: 4.4 mmol/L (ref 3.5–5.2)
Sodium: 139 mmol/L (ref 134–144)

## 2019-03-15 DIAGNOSIS — R2689 Other abnormalities of gait and mobility: Secondary | ICD-10-CM | POA: Diagnosis not present

## 2019-03-15 DIAGNOSIS — H409 Unspecified glaucoma: Secondary | ICD-10-CM | POA: Diagnosis not present

## 2019-03-15 DIAGNOSIS — F330 Major depressive disorder, recurrent, mild: Secondary | ICD-10-CM | POA: Diagnosis not present

## 2019-03-15 DIAGNOSIS — M810 Age-related osteoporosis without current pathological fracture: Secondary | ICD-10-CM | POA: Diagnosis not present

## 2019-03-15 DIAGNOSIS — M6281 Muscle weakness (generalized): Secondary | ICD-10-CM | POA: Diagnosis not present

## 2019-03-15 DIAGNOSIS — K5900 Constipation, unspecified: Secondary | ICD-10-CM | POA: Diagnosis not present

## 2019-03-15 DIAGNOSIS — R110 Nausea: Secondary | ICD-10-CM | POA: Diagnosis not present

## 2019-03-15 DIAGNOSIS — R296 Repeated falls: Secondary | ICD-10-CM | POA: Diagnosis not present

## 2019-03-15 DIAGNOSIS — I4891 Unspecified atrial fibrillation: Secondary | ICD-10-CM | POA: Diagnosis not present

## 2019-03-15 DIAGNOSIS — K219 Gastro-esophageal reflux disease without esophagitis: Secondary | ICD-10-CM | POA: Diagnosis not present

## 2019-03-15 DIAGNOSIS — G20 Parkinson's disease: Secondary | ICD-10-CM | POA: Diagnosis not present

## 2019-03-15 DIAGNOSIS — G500 Trigeminal neuralgia: Secondary | ICD-10-CM | POA: Diagnosis not present

## 2019-03-18 DIAGNOSIS — Z79899 Other long term (current) drug therapy: Secondary | ICD-10-CM | POA: Diagnosis not present

## 2019-03-20 ENCOUNTER — Encounter: Payer: PPO | Admitting: *Deleted

## 2019-03-21 ENCOUNTER — Telehealth: Payer: Self-pay

## 2019-03-21 NOTE — Telephone Encounter (Signed)
Left message for patient to remind of missed remote transmission. LL 

## 2019-03-27 DIAGNOSIS — R040 Epistaxis: Secondary | ICD-10-CM | POA: Diagnosis not present

## 2019-04-02 DIAGNOSIS — Z1159 Encounter for screening for other viral diseases: Secondary | ICD-10-CM | POA: Diagnosis not present

## 2019-04-10 DIAGNOSIS — Z03818 Encounter for observation for suspected exposure to other biological agents ruled out: Secondary | ICD-10-CM | POA: Diagnosis not present

## 2019-04-17 DIAGNOSIS — G20 Parkinson's disease: Secondary | ICD-10-CM | POA: Diagnosis not present

## 2019-04-17 DIAGNOSIS — K219 Gastro-esophageal reflux disease without esophagitis: Secondary | ICD-10-CM | POA: Diagnosis not present

## 2019-04-17 DIAGNOSIS — G500 Trigeminal neuralgia: Secondary | ICD-10-CM | POA: Diagnosis not present

## 2019-04-17 DIAGNOSIS — M6281 Muscle weakness (generalized): Secondary | ICD-10-CM | POA: Diagnosis not present

## 2019-04-17 DIAGNOSIS — M810 Age-related osteoporosis without current pathological fracture: Secondary | ICD-10-CM | POA: Diagnosis not present

## 2019-04-17 DIAGNOSIS — R2689 Other abnormalities of gait and mobility: Secondary | ICD-10-CM | POA: Diagnosis not present

## 2019-04-17 DIAGNOSIS — R296 Repeated falls: Secondary | ICD-10-CM | POA: Diagnosis not present

## 2019-04-17 DIAGNOSIS — F330 Major depressive disorder, recurrent, mild: Secondary | ICD-10-CM | POA: Diagnosis not present

## 2019-04-17 DIAGNOSIS — I4891 Unspecified atrial fibrillation: Secondary | ICD-10-CM | POA: Diagnosis not present

## 2019-04-17 DIAGNOSIS — K5900 Constipation, unspecified: Secondary | ICD-10-CM | POA: Diagnosis not present

## 2019-04-17 DIAGNOSIS — R110 Nausea: Secondary | ICD-10-CM | POA: Diagnosis not present

## 2019-04-17 DIAGNOSIS — H409 Unspecified glaucoma: Secondary | ICD-10-CM | POA: Diagnosis not present

## 2019-04-30 DIAGNOSIS — G20 Parkinson's disease: Secondary | ICD-10-CM | POA: Diagnosis not present

## 2019-04-30 DIAGNOSIS — Z1159 Encounter for screening for other viral diseases: Secondary | ICD-10-CM | POA: Diagnosis not present

## 2019-04-30 DIAGNOSIS — G500 Trigeminal neuralgia: Secondary | ICD-10-CM | POA: Diagnosis not present

## 2019-04-30 DIAGNOSIS — R2689 Other abnormalities of gait and mobility: Secondary | ICD-10-CM | POA: Diagnosis not present

## 2019-04-30 DIAGNOSIS — I4891 Unspecified atrial fibrillation: Secondary | ICD-10-CM | POA: Diagnosis not present

## 2019-04-30 DIAGNOSIS — R296 Repeated falls: Secondary | ICD-10-CM | POA: Diagnosis not present

## 2019-04-30 DIAGNOSIS — M6281 Muscle weakness (generalized): Secondary | ICD-10-CM | POA: Diagnosis not present

## 2019-05-06 DIAGNOSIS — Z1159 Encounter for screening for other viral diseases: Secondary | ICD-10-CM | POA: Diagnosis not present

## 2019-05-23 DIAGNOSIS — U071 COVID-19: Secondary | ICD-10-CM | POA: Diagnosis not present

## 2019-05-23 DIAGNOSIS — E7800 Pure hypercholesterolemia, unspecified: Secondary | ICD-10-CM | POA: Diagnosis not present

## 2019-05-23 DIAGNOSIS — D649 Anemia, unspecified: Secondary | ICD-10-CM | POA: Diagnosis not present

## 2019-05-24 DIAGNOSIS — I10 Essential (primary) hypertension: Secondary | ICD-10-CM | POA: Diagnosis not present

## 2019-05-24 DIAGNOSIS — G500 Trigeminal neuralgia: Secondary | ICD-10-CM | POA: Diagnosis not present

## 2019-05-24 DIAGNOSIS — U071 COVID-19: Secondary | ICD-10-CM | POA: Diagnosis not present

## 2019-06-05 DIAGNOSIS — I10 Essential (primary) hypertension: Secondary | ICD-10-CM | POA: Diagnosis not present

## 2019-06-07 DIAGNOSIS — R296 Repeated falls: Secondary | ICD-10-CM | POA: Diagnosis not present

## 2019-06-10 ENCOUNTER — Telehealth: Payer: Self-pay | Admitting: Emergency Medicine

## 2019-06-10 NOTE — Telephone Encounter (Signed)
[facility] - interpreter [phone]. HVR episode. Need to assess for symptoms and if metoprolol 25 mg BID is being taken.

## 2019-06-11 NOTE — Telephone Encounter (Signed)
Spoke with pt son, Teresa Bates.  DPR on record.  Advised of HVR occurring 1/29 0940.  He states his mom recently restarted PT and believes she may have been walking at time of event.  Per son pt has not reported any symptoms occurring.  Son also confirmed pt taking Metoprolol 25mg  BID.   Pt son aware to monitor for pt symptoms during increased activity for symptoms.    Routing to MD for review.

## 2019-06-11 NOTE — Telephone Encounter (Signed)
The pt son returning the nurse phone call. He states he speaks [place]. The nurse can call him [phone].

## 2019-06-12 NOTE — Telephone Encounter (Signed)
Increase toprol XL to 25mg daily.

## 2019-06-13 ENCOUNTER — Ambulatory Visit: Payer: PPO | Admitting: Cardiology

## 2019-06-13 NOTE — Telephone Encounter (Addendum)
Son returns call. Pt is taking Toprol 25 mg BID currently. Will forward back to Dr. Elberta Fortis for clarification of his recommendation. When I return call, if son doesn't answer, will leave detailed message in reference to new recommendation. Son is agreeable to plan.

## 2019-06-13 NOTE — Telephone Encounter (Signed)
lmtcb

## 2019-06-14 DIAGNOSIS — R222 Localized swelling, mass and lump, trunk: Secondary | ICD-10-CM | POA: Diagnosis not present

## 2019-06-14 DIAGNOSIS — R296 Repeated falls: Secondary | ICD-10-CM | POA: Diagnosis not present

## 2019-06-14 NOTE — Telephone Encounter (Signed)
Spoke with the patients son to advise him of Dr. Elberta Fortis recommendation to increase patients metoprolol to 50 mg BID.   Updated the patients Medication in the chart.   Called [facility] and LMTCB on their line in order to inform them of the med change.  Forwarded the message Dr. Prudence Davidson.

## 2019-06-14 NOTE — Telephone Encounter (Signed)
Increase metoprolol to 50mg BID

## 2019-06-16 DIAGNOSIS — I10 Essential (primary) hypertension: Secondary | ICD-10-CM | POA: Diagnosis not present

## 2019-06-16 DIAGNOSIS — M6281 Muscle weakness (generalized): Secondary | ICD-10-CM | POA: Diagnosis not present

## 2019-06-16 DIAGNOSIS — H409 Unspecified glaucoma: Secondary | ICD-10-CM | POA: Diagnosis not present

## 2019-06-16 DIAGNOSIS — H40219 Acute angle-closure glaucoma, unspecified eye: Secondary | ICD-10-CM | POA: Diagnosis not present

## 2019-06-16 DIAGNOSIS — E559 Vitamin D deficiency, unspecified: Secondary | ICD-10-CM | POA: Diagnosis not present

## 2019-06-16 DIAGNOSIS — G8929 Other chronic pain: Secondary | ICD-10-CM | POA: Diagnosis not present

## 2019-06-16 DIAGNOSIS — G20 Parkinson's disease: Secondary | ICD-10-CM | POA: Diagnosis not present

## 2019-06-16 DIAGNOSIS — M810 Age-related osteoporosis without current pathological fracture: Secondary | ICD-10-CM | POA: Diagnosis not present

## 2019-06-16 DIAGNOSIS — R2689 Other abnormalities of gait and mobility: Secondary | ICD-10-CM | POA: Diagnosis not present

## 2019-06-16 DIAGNOSIS — I4891 Unspecified atrial fibrillation: Secondary | ICD-10-CM | POA: Diagnosis not present

## 2019-06-16 DIAGNOSIS — K219 Gastro-esophageal reflux disease without esophagitis: Secondary | ICD-10-CM | POA: Diagnosis not present

## 2019-06-16 DIAGNOSIS — K5900 Constipation, unspecified: Secondary | ICD-10-CM | POA: Diagnosis not present

## 2019-06-19 ENCOUNTER — Ambulatory Visit (INDEPENDENT_AMBULATORY_CARE_PROVIDER_SITE_OTHER): Payer: PPO | Admitting: *Deleted

## 2019-06-19 DIAGNOSIS — Z950 Presence of cardiac pacemaker: Secondary | ICD-10-CM | POA: Diagnosis not present

## 2019-06-19 DIAGNOSIS — L729 Follicular cyst of the skin and subcutaneous tissue, unspecified: Secondary | ICD-10-CM | POA: Diagnosis not present

## 2019-06-19 LAB — CUP PACEART REMOTE DEVICE CHECK
Battery Remaining Longevity: 107 mo
Battery Remaining Percentage: 95.5 %
Battery Voltage: 2.98 V
Brady Statistic AP VP Percent: 1.3 %
Brady Statistic AP VS Percent: 72 %
Brady Statistic AS VP Percent: 1 %
Brady Statistic AS VS Percent: 26 %
Brady Statistic RA Percent Paced: 70 %
Brady Statistic RV Percent Paced: 1.5 %
Date Time Interrogation Session: 20210210040702
Implantable Lead Implant Date: 20051022
Implantable Lead Implant Date: 20051022
Implantable Lead Location: 753859
Implantable Lead Location: 753860
Implantable Lead Model: 5076
Implantable Pulse Generator Implant Date: 20150601
Lead Channel Impedance Value: 380 Ohm
Lead Channel Impedance Value: 530 Ohm
Lead Channel Pacing Threshold Amplitude: 1 V
Lead Channel Pacing Threshold Amplitude: 1.75 V
Lead Channel Pacing Threshold Pulse Width: 0.4 ms
Lead Channel Pacing Threshold Pulse Width: 0.4 ms
Lead Channel Sensing Intrinsic Amplitude: 1 mV
Lead Channel Sensing Intrinsic Amplitude: 9.6 mV
Lead Channel Setting Pacing Amplitude: 2 V
Lead Channel Setting Pacing Amplitude: 2.5 V
Lead Channel Setting Pacing Pulse Width: 0.4 ms
Lead Channel Setting Sensing Sensitivity: 2 mV
Pulse Gen Model: 2240
Pulse Gen Serial Number: 7612285

## 2019-06-19 NOTE — Progress Notes (Signed)
PPM Remote  

## 2019-06-20 DIAGNOSIS — G20 Parkinson's disease: Secondary | ICD-10-CM | POA: Diagnosis not present

## 2019-06-20 DIAGNOSIS — R2681 Unsteadiness on feet: Secondary | ICD-10-CM | POA: Diagnosis not present

## 2019-06-25 DIAGNOSIS — G500 Trigeminal neuralgia: Secondary | ICD-10-CM | POA: Diagnosis not present

## 2019-06-25 DIAGNOSIS — M810 Age-related osteoporosis without current pathological fracture: Secondary | ICD-10-CM | POA: Diagnosis not present

## 2019-06-25 DIAGNOSIS — M6281 Muscle weakness (generalized): Secondary | ICD-10-CM | POA: Diagnosis not present

## 2019-06-25 DIAGNOSIS — I4891 Unspecified atrial fibrillation: Secondary | ICD-10-CM | POA: Diagnosis not present

## 2019-06-25 DIAGNOSIS — G20 Parkinson's disease: Secondary | ICD-10-CM | POA: Diagnosis not present

## 2019-07-09 DIAGNOSIS — G20 Parkinson's disease: Secondary | ICD-10-CM | POA: Diagnosis not present

## 2019-07-09 DIAGNOSIS — R2681 Unsteadiness on feet: Secondary | ICD-10-CM | POA: Diagnosis not present

## 2019-07-09 DIAGNOSIS — R2689 Other abnormalities of gait and mobility: Secondary | ICD-10-CM | POA: Diagnosis not present

## 2019-07-15 DIAGNOSIS — M6281 Muscle weakness (generalized): Secondary | ICD-10-CM | POA: Diagnosis not present

## 2019-07-15 DIAGNOSIS — I10 Essential (primary) hypertension: Secondary | ICD-10-CM | POA: Diagnosis not present

## 2019-07-15 DIAGNOSIS — I4891 Unspecified atrial fibrillation: Secondary | ICD-10-CM | POA: Diagnosis not present

## 2019-07-15 DIAGNOSIS — G8929 Other chronic pain: Secondary | ICD-10-CM | POA: Diagnosis not present

## 2019-07-15 DIAGNOSIS — G500 Trigeminal neuralgia: Secondary | ICD-10-CM | POA: Diagnosis not present

## 2019-07-15 DIAGNOSIS — H409 Unspecified glaucoma: Secondary | ICD-10-CM | POA: Diagnosis not present

## 2019-07-15 DIAGNOSIS — G20 Parkinson's disease: Secondary | ICD-10-CM | POA: Diagnosis not present

## 2019-07-15 DIAGNOSIS — H40219 Acute angle-closure glaucoma, unspecified eye: Secondary | ICD-10-CM | POA: Diagnosis not present

## 2019-07-15 DIAGNOSIS — F329 Major depressive disorder, single episode, unspecified: Secondary | ICD-10-CM | POA: Diagnosis not present

## 2019-07-15 DIAGNOSIS — M810 Age-related osteoporosis without current pathological fracture: Secondary | ICD-10-CM | POA: Diagnosis not present

## 2019-07-15 DIAGNOSIS — E559 Vitamin D deficiency, unspecified: Secondary | ICD-10-CM | POA: Diagnosis not present

## 2019-07-15 DIAGNOSIS — K5900 Constipation, unspecified: Secondary | ICD-10-CM | POA: Diagnosis not present

## 2019-07-17 DIAGNOSIS — M6281 Muscle weakness (generalized): Secondary | ICD-10-CM | POA: Diagnosis not present

## 2019-07-19 DIAGNOSIS — H04123 Dry eye syndrome of bilateral lacrimal glands: Secondary | ICD-10-CM | POA: Diagnosis not present

## 2019-07-19 DIAGNOSIS — H409 Unspecified glaucoma: Secondary | ICD-10-CM | POA: Diagnosis not present

## 2019-07-22 DIAGNOSIS — G894 Chronic pain syndrome: Secondary | ICD-10-CM | POA: Diagnosis not present

## 2019-07-22 DIAGNOSIS — G500 Trigeminal neuralgia: Secondary | ICD-10-CM | POA: Diagnosis not present

## 2019-07-24 DIAGNOSIS — R21 Rash and other nonspecific skin eruption: Secondary | ICD-10-CM | POA: Diagnosis not present

## 2019-08-08 DIAGNOSIS — R2689 Other abnormalities of gait and mobility: Secondary | ICD-10-CM | POA: Diagnosis not present

## 2019-08-08 DIAGNOSIS — R2681 Unsteadiness on feet: Secondary | ICD-10-CM | POA: Diagnosis not present

## 2019-08-08 DIAGNOSIS — G20 Parkinson's disease: Secondary | ICD-10-CM | POA: Diagnosis not present

## 2019-08-12 ENCOUNTER — Ambulatory Visit: Payer: PPO | Admitting: Cardiology

## 2019-08-14 DIAGNOSIS — M810 Age-related osteoporosis without current pathological fracture: Secondary | ICD-10-CM | POA: Diagnosis not present

## 2019-08-14 DIAGNOSIS — G500 Trigeminal neuralgia: Secondary | ICD-10-CM | POA: Diagnosis not present

## 2019-08-14 DIAGNOSIS — G20 Parkinson's disease: Secondary | ICD-10-CM | POA: Diagnosis not present

## 2019-08-14 DIAGNOSIS — K5900 Constipation, unspecified: Secondary | ICD-10-CM | POA: Diagnosis not present

## 2019-08-14 DIAGNOSIS — I10 Essential (primary) hypertension: Secondary | ICD-10-CM | POA: Diagnosis not present

## 2019-08-14 DIAGNOSIS — H40219 Acute angle-closure glaucoma, unspecified eye: Secondary | ICD-10-CM | POA: Diagnosis not present

## 2019-08-14 DIAGNOSIS — M6281 Muscle weakness (generalized): Secondary | ICD-10-CM | POA: Diagnosis not present

## 2019-08-14 DIAGNOSIS — H04123 Dry eye syndrome of bilateral lacrimal glands: Secondary | ICD-10-CM | POA: Diagnosis not present

## 2019-08-14 DIAGNOSIS — G8929 Other chronic pain: Secondary | ICD-10-CM | POA: Diagnosis not present

## 2019-08-14 DIAGNOSIS — F329 Major depressive disorder, single episode, unspecified: Secondary | ICD-10-CM | POA: Diagnosis not present

## 2019-08-14 DIAGNOSIS — I4891 Unspecified atrial fibrillation: Secondary | ICD-10-CM | POA: Diagnosis not present

## 2019-08-14 DIAGNOSIS — E559 Vitamin D deficiency, unspecified: Secondary | ICD-10-CM | POA: Diagnosis not present

## 2019-08-15 DIAGNOSIS — E039 Hypothyroidism, unspecified: Secondary | ICD-10-CM | POA: Diagnosis not present

## 2019-08-15 DIAGNOSIS — Z79899 Other long term (current) drug therapy: Secondary | ICD-10-CM | POA: Diagnosis not present

## 2019-08-15 DIAGNOSIS — E559 Vitamin D deficiency, unspecified: Secondary | ICD-10-CM | POA: Diagnosis not present

## 2019-08-19 DIAGNOSIS — R296 Repeated falls: Secondary | ICD-10-CM | POA: Diagnosis not present

## 2019-08-22 DIAGNOSIS — I959 Hypotension, unspecified: Secondary | ICD-10-CM | POA: Diagnosis not present

## 2019-08-22 DIAGNOSIS — I4891 Unspecified atrial fibrillation: Secondary | ICD-10-CM | POA: Diagnosis not present

## 2019-08-22 DIAGNOSIS — R609 Edema, unspecified: Secondary | ICD-10-CM | POA: Diagnosis not present

## 2019-08-22 DIAGNOSIS — K219 Gastro-esophageal reflux disease without esophagitis: Secondary | ICD-10-CM | POA: Diagnosis not present

## 2019-08-22 DIAGNOSIS — K5900 Constipation, unspecified: Secondary | ICD-10-CM | POA: Diagnosis not present

## 2019-08-22 DIAGNOSIS — G500 Trigeminal neuralgia: Secondary | ICD-10-CM | POA: Diagnosis not present

## 2019-08-28 ENCOUNTER — Ambulatory Visit: Payer: PPO | Admitting: Family Medicine

## 2019-09-01 NOTE — Progress Notes (Signed)
Cardiology Office Note:    Date:  09/02/2019   ID:  Teresa Bates, DOB February 19, 1930, MRN [id number]  PCP:  Rica Koyanagi, MD  Cardiologist:  No primary care provider on file.  Electrophysiologist:  None   Referring MD: Rica Koyanagi, MD   Chief Complaint  Patient presents with  . Atrial Fibrillation    History of Present Illness:    Teresa Bates is a 86 y.o. female with a hx of PAF with tachybarady syndrome s/p PPM, HTN, Parkinsons disease who presents for a follow-up visit.  She was initially seen in clinic on 02/12/2019.  She had previously followed with a cardiologist in [place] and moved to [place] in 2020.  At her initial visit, she was referred to EP to follow-up for her device.  She saw Dr. Curt Bears and enrolled in remote monitoring.  Her blood pressure was noted to be significantly elevated.  She was started on lisinopril 5 mg daily in addition to Toprol-XL 25 mg daily.  TTE was done and showed reduced systolic function, 40 to 28%.  There is abnormal septal motion consistent with left bundle branch block.  Mild to moderate TR.  Mild MR.  Since last clinic visit, she reports that she has had multiple falls.  States that she has fallen 10 times in the last couple of months.  She is always falling backwards, states that she has never hit her head.  She denies any lightheadedness or dizziness.  States that she feels like her legs just give out.   Past Medical History:  Diagnosis Date  . A-fib (HCC)    PAROXYSMAL  . Abnormal electrocardiogram   . Anxiety   . Aortic regurgitation   . Arthritis   . Depression   . Essential hypertension 02/05/2018  . Hypertension   . Hypomagnesemia 02/05/2018  . Lumbar compression fracture ([place]) 01/31/2018  . Mitral regurgitation   . Nonrheumatic mitral (valve) insufficiency   . Parkinson's disease [facility])     Past Surgical History:  Procedure Laterality Date  . PACEMAKER INSERTION  10/2013    Current Medications: Current Meds    Medication Sig  . acetaminophen (TYLENOL) 500 MG tablet Take 500 mg by mouth every 6 (six) hours as needed.  . calcium-vitamin D (OSCAL WITH D) 500-200 MG-UNIT tablet Take 1 tablet by mouth daily with breakfast.  . Cholecalciferol (VITAMIN D3) 50 MCG (2000 UT) TABS Take 50 mcg by mouth daily.  . citalopram (CELEXA) 20 MG tablet Take 20 mg by mouth at bedtime.  . metoprolol succinate (TOPROL-XL) 25 MG 24 hr tablet Take 25 mg by mouth daily.  Marland Kitchen omeprazole (PRILOSEC) 20 MG capsule Take 20 mg by mouth daily.  . ondansetron (ZOFRAN) 4 MG tablet Take 1 tablet (4 mg total) by mouth every 6 (six) hours as needed for nausea.  . pramipexole (MIRAPEX) 0.25 MG tablet Take 0.25 mg by mouth 3 (three) times daily.  . pregabalin (LYRICA) 75 MG capsule Take 100 mg by mouth every 8 (eight) hours.   . senna-docusate (SENOKOT-S) 8.6-50 MG tablet Take 1 tablet by mouth 2 (two) times daily.  . traMADol (ULTRAM) 50 MG tablet Take 1 tablet (50 mg total) by mouth 3 (three) times daily as needed for moderate pain.  . [DISCONTINUED] apixaban (ELIQUIS) 2.5 MG TABS tablet Take 2.5 mg by mouth 2 (two) times daily.     Allergies:   Carbamazepine   Social History   Socioeconomic History  . Marital status: Widowed    Spouse name: Not  on file  . Number of children: Not on file  . Years of education: Not on file  . Highest education level: Not on file  Occupational History  . Not on file  Tobacco Use  . Smoking status: Never Smoker  . Smokeless tobacco: Never Used  Substance and Sexual Activity  . Alcohol use: Never  . Drug use: Never  . Sexual activity: Not on file  Other Topics Concern  . Not on file  Social History Narrative  . Not on file   Social Determinants of Health   Financial Resource Strain:   . Difficulty of Paying Living Expenses:   Food Insecurity:   . Worried About Programme researcher, broadcasting/film/video in the Last Year:   . Barista in the Last Year:   Transportation Needs:   . Automotive engineer (Medical):   Marland Kitchen Lack of Transportation (Non-Medical):   Physical Activity:   . Days of Exercise per Week:   . Minutes of Exercise per Session:   Stress:   . Feeling of Stress :   Social Connections:   . Frequency of Communication with Friends and Family:   . Frequency of Social Gatherings with Friends and Family:   . Attends Religious Services:   . Active Member of Clubs or Organizations:   . Attends Banker Meetings:   Marland Kitchen Marital Status:      Family History: The patient's family history includes Heart attack in her father; Heart failure in her mother.  ROS:   Please see the history of present illness.     All other systems reviewed and are negative.  EKGs/Labs/Other Studies Reviewed:    The following studies were reviewed today:   EKG:  EKG is ordered today.  EKG shows atrial paced rhythm, PACS, LBBB, rate 85  Recent Labs: 03/13/2019: BUN 24; Creatinine, Ser 0.78; Potassium 4.4; Sodium 139  Recent Lipid Panel No results found for: CHOL, TRIG, HDL, CHOLHDL, VLDL, LDLCALC, LDLDIRECT  Physical Exam:    VS:  BP 130/80   Pulse 85   Ht 5' (1.524 m)   Wt 110 lb 6.4 oz (50.1 kg)   BMI 21.56 kg/m     Wt Readings from Last 3 Encounters:  09/02/19 110 lb 6.4 oz (50.1 kg)  03/13/19 107 lb (48.5 kg)  02/26/19 108 lb (49 kg)     GEN:  in no acute distress HEENT: Normal NECK: No JVD LYMPHATICS: No lymphadenopathy CARDIAC: RRR, no murmurs RESPIRATORY:  Clear to auscultation without rales, wheezing or rhonchi  ABDOMEN: Soft, non-tender, non-distended MUSCULOSKELETAL:  No edema; No deformity  SKIN: Warm and dry NEUROLOGIC:  Alert and oriented x 3 PSYCHIATRIC:  Normal affect   ASSESSMENT:    1. Paroxysmal A-fib (HCC)   2. Chronic combined systolic and diastolic heart failure (HCC)   3. Pacemaker   4. Tachy-brady syndrome (HCC)   5. Essential hypertension    PLAN:    Paroxysmal atrial fibrillation: Currently in sinus rhythm.  Status post  pacemaker for tachybrady syndrome.  CHADS-VASc 5, on Eliquis -Had long discussion on risk/benefits of anticoagulation with patient and her son given patient's frequent falls recently.  She has had about 10 falls in the last couple of months.  After discussion with patient and son, decision was made to stop Eliquis due to bleeding risk from frequent falls.  They recognize that this increases the risk of stroke, but given frequent falls the risk of bleeding is high, and are in  agreement with stopping Eliquis at this time. -Continue metoprolol 25 mg daily  Chronic combined systolic and diastolic heart failure: EF 40 to 45%.  She appears compensated, currently euvolemic.  Unclear etiology.  Could be related to RV pacing/LBBB.  No anginal symptoms to suggest CAD.  Given her age and fragility, she is not a good candidate for heart catheterization.  Discussed with patient and her son, and they agreed that they would not want to pursue invasive procedures.  Managing medically, is currently on lisinopril 5 mg daily and Toprol XL 25 mg daily.  Hypertension: On lisinopril 5 mg daily and Toprol-XL 25 mg daily.  Appears controlled  Tachybrady syndrome: Status post PPM.  Follows with EP  RTC in 6 months  Medication Adjustments/Labs and Tests Ordered: Current medicines are reviewed at length with the patient today.  Concerns regarding medicines are outlined above.  Orders Placed This Encounter  Procedures  . EKG 12-Lead   No orders of the defined types were placed in this encounter.   Patient Instructions  Medication Instructions:  STOP Eliquis  *If you need a refill on your cardiac medications before your next appointment, please call your pharmacy*  Follow-Up: At [facility], you and your health needs are our priority.  As part of our continuing mission to provide you with exceptional heart care, we have created designated Provider Care Teams.  These Care Teams include your primary Cardiologist  (physician) and Advanced Practice Providers (APPs -  Physician Assistants and Nurse Practitioners) who all work together to provide you with the care you need, when you need it.  We recommend signing up for the patient portal called "MyChart".  Sign up information is provided on this After Visit Summary.  MyChart is used to connect with patients for Virtual Visits (Telemedicine).  Patients are able to view lab/test results, encounter notes, upcoming appointments, etc.  Non-urgent messages can be sent to your provider as well.   To learn more about what you can do with MyChart, go to [email or web address].    Your next appointment:   6 month(s)  The format for your next appointment:   In Person  Provider:   Epifanio Lesches, MD        Signed, Little Ishikawa, MD  09/02/2019 6:51 PM    [facility]

## 2019-09-02 ENCOUNTER — Other Ambulatory Visit: Payer: Self-pay

## 2019-09-02 ENCOUNTER — Encounter: Payer: Self-pay | Admitting: Cardiology

## 2019-09-02 ENCOUNTER — Ambulatory Visit: Payer: PPO | Admitting: Cardiology

## 2019-09-02 VITALS — BP 130/80 | HR 85 | Ht 60.0 in | Wt 110.4 lb

## 2019-09-02 DIAGNOSIS — Z950 Presence of cardiac pacemaker: Secondary | ICD-10-CM | POA: Diagnosis not present

## 2019-09-02 DIAGNOSIS — I480 Paroxysmal atrial fibrillation: Secondary | ICD-10-CM

## 2019-09-02 DIAGNOSIS — I495 Sick sinus syndrome: Secondary | ICD-10-CM | POA: Diagnosis not present

## 2019-09-02 DIAGNOSIS — I10 Essential (primary) hypertension: Secondary | ICD-10-CM

## 2019-09-02 DIAGNOSIS — I5042 Chronic combined systolic (congestive) and diastolic (congestive) heart failure: Secondary | ICD-10-CM

## 2019-09-02 NOTE — Patient Instructions (Signed)
Medication Instructions:  STOP Eliquis  *If you need a refill on your cardiac medications before your next appointment, please call your pharmacy*  Follow-Up: At [facility], you and your health needs are our priority.  As part of our continuing mission to provide you with exceptional heart care, we have created designated Provider Care Teams.  These Care Teams include your primary Cardiologist (physician) and Advanced Practice Providers (APPs -  Physician Assistants and Nurse Practitioners) who all work together to provide you with the care you need, when you need it.  We recommend signing up for the patient portal called "MyChart".  Sign up information is provided on this After Visit Summary.  MyChart is used to connect with patients for Virtual Visits (Telemedicine).  Patients are able to view lab/test results, encounter notes, upcoming appointments, etc.  Non-urgent messages can be sent to your provider as well.   To learn more about what you can do with MyChart, go to [email or web address].    Your next appointment:   6 month(s)  The format for your next appointment:   In Person  Provider:   Epifanio Lesches, MD

## 2019-09-04 DIAGNOSIS — I4891 Unspecified atrial fibrillation: Secondary | ICD-10-CM | POA: Diagnosis not present

## 2019-09-04 DIAGNOSIS — R3915 Urgency of urination: Secondary | ICD-10-CM | POA: Diagnosis not present

## 2019-09-04 DIAGNOSIS — R296 Repeated falls: Secondary | ICD-10-CM | POA: Diagnosis not present

## 2019-09-05 DIAGNOSIS — R262 Difficulty in walking, not elsewhere classified: Secondary | ICD-10-CM | POA: Diagnosis not present

## 2019-09-05 DIAGNOSIS — E7849 Other hyperlipidemia: Secondary | ICD-10-CM | POA: Diagnosis not present

## 2019-09-05 DIAGNOSIS — E785 Hyperlipidemia, unspecified: Secondary | ICD-10-CM | POA: Diagnosis not present

## 2019-09-05 DIAGNOSIS — N390 Urinary tract infection, site not specified: Secondary | ICD-10-CM | POA: Diagnosis not present

## 2019-09-05 DIAGNOSIS — D649 Anemia, unspecified: Secondary | ICD-10-CM | POA: Diagnosis not present

## 2019-09-05 DIAGNOSIS — D519 Vitamin B12 deficiency anemia, unspecified: Secondary | ICD-10-CM | POA: Diagnosis not present

## 2019-09-05 DIAGNOSIS — E039 Hypothyroidism, unspecified: Secondary | ICD-10-CM | POA: Diagnosis not present

## 2019-09-05 DIAGNOSIS — Z79899 Other long term (current) drug therapy: Secondary | ICD-10-CM | POA: Diagnosis not present

## 2019-09-05 DIAGNOSIS — R319 Hematuria, unspecified: Secondary | ICD-10-CM | POA: Diagnosis not present

## 2019-09-09 DIAGNOSIS — M6281 Muscle weakness (generalized): Secondary | ICD-10-CM | POA: Diagnosis not present

## 2019-09-09 DIAGNOSIS — K5900 Constipation, unspecified: Secondary | ICD-10-CM | POA: Diagnosis not present

## 2019-09-09 DIAGNOSIS — E559 Vitamin D deficiency, unspecified: Secondary | ICD-10-CM | POA: Diagnosis not present

## 2019-09-09 DIAGNOSIS — M810 Age-related osteoporosis without current pathological fracture: Secondary | ICD-10-CM | POA: Diagnosis not present

## 2019-09-09 DIAGNOSIS — H04123 Dry eye syndrome of bilateral lacrimal glands: Secondary | ICD-10-CM | POA: Diagnosis not present

## 2019-09-09 DIAGNOSIS — K219 Gastro-esophageal reflux disease without esophagitis: Secondary | ICD-10-CM | POA: Diagnosis not present

## 2019-09-09 DIAGNOSIS — R296 Repeated falls: Secondary | ICD-10-CM | POA: Diagnosis not present

## 2019-09-09 DIAGNOSIS — H40219 Acute angle-closure glaucoma, unspecified eye: Secondary | ICD-10-CM | POA: Diagnosis not present

## 2019-09-09 DIAGNOSIS — R609 Edema, unspecified: Secondary | ICD-10-CM | POA: Diagnosis not present

## 2019-09-09 DIAGNOSIS — G20 Parkinson's disease: Secondary | ICD-10-CM | POA: Diagnosis not present

## 2019-09-09 DIAGNOSIS — I4891 Unspecified atrial fibrillation: Secondary | ICD-10-CM | POA: Diagnosis not present

## 2019-09-09 DIAGNOSIS — G500 Trigeminal neuralgia: Secondary | ICD-10-CM | POA: Diagnosis not present

## 2019-09-13 DIAGNOSIS — R2689 Other abnormalities of gait and mobility: Secondary | ICD-10-CM | POA: Diagnosis not present

## 2019-09-13 DIAGNOSIS — G20 Parkinson's disease: Secondary | ICD-10-CM | POA: Diagnosis not present

## 2019-09-13 DIAGNOSIS — R2681 Unsteadiness on feet: Secondary | ICD-10-CM | POA: Diagnosis not present

## 2019-09-16 DIAGNOSIS — G8929 Other chronic pain: Secondary | ICD-10-CM | POA: Diagnosis not present

## 2019-09-18 ENCOUNTER — Ambulatory Visit (INDEPENDENT_AMBULATORY_CARE_PROVIDER_SITE_OTHER): Payer: PPO | Admitting: *Deleted

## 2019-09-18 DIAGNOSIS — I495 Sick sinus syndrome: Secondary | ICD-10-CM | POA: Diagnosis not present

## 2019-09-18 LAB — CUP PACEART REMOTE DEVICE CHECK
Battery Remaining Longevity: 101 mo
Battery Remaining Percentage: 95.5 %
Battery Voltage: 2.96 V
Brady Statistic AP VP Percent: 1.4 %
Brady Statistic AP VS Percent: 74 %
Brady Statistic AS VP Percent: 1 %
Brady Statistic AS VS Percent: 25 %
Brady Statistic RA Percent Paced: 73 %
Brady Statistic RV Percent Paced: 1.5 %
Date Time Interrogation Session: 20210512020028
Implantable Lead Implant Date: 20051022
Implantable Lead Implant Date: 20051022
Implantable Lead Location: 753859
Implantable Lead Location: 753860
Implantable Lead Model: 5076
Implantable Pulse Generator Implant Date: 20150601
Lead Channel Impedance Value: 380 Ohm
Lead Channel Impedance Value: 540 Ohm
Lead Channel Pacing Threshold Amplitude: 1 V
Lead Channel Pacing Threshold Amplitude: 1.625 V
Lead Channel Pacing Threshold Pulse Width: 0.4 ms
Lead Channel Pacing Threshold Pulse Width: 0.4 ms
Lead Channel Sensing Intrinsic Amplitude: 1.6 mV
Lead Channel Sensing Intrinsic Amplitude: 8.8 mV
Lead Channel Setting Pacing Amplitude: 1.875
Lead Channel Setting Pacing Amplitude: 2.5 V
Lead Channel Setting Pacing Pulse Width: 0.4 ms
Lead Channel Setting Sensing Sensitivity: 2 mV
Pulse Gen Model: 2240
Pulse Gen Serial Number: 7612285

## 2019-09-19 NOTE — Progress Notes (Signed)
Remote pacemaker transmission.   

## 2019-09-24 DIAGNOSIS — H2513 Age-related nuclear cataract, bilateral: Secondary | ICD-10-CM | POA: Diagnosis not present

## 2019-09-24 DIAGNOSIS — H35363 Drusen (degenerative) of macula, bilateral: Secondary | ICD-10-CM | POA: Diagnosis not present

## 2019-09-24 DIAGNOSIS — H25013 Cortical age-related cataract, bilateral: Secondary | ICD-10-CM | POA: Diagnosis not present

## 2019-09-24 DIAGNOSIS — H401134 Primary open-angle glaucoma, bilateral, indeterminate stage: Secondary | ICD-10-CM | POA: Diagnosis not present

## 2019-09-24 DIAGNOSIS — H35013 Changes in retinal vascular appearance, bilateral: Secondary | ICD-10-CM | POA: Diagnosis not present

## 2019-10-02 DIAGNOSIS — R296 Repeated falls: Secondary | ICD-10-CM | POA: Diagnosis not present

## 2019-10-09 DIAGNOSIS — K219 Gastro-esophageal reflux disease without esophagitis: Secondary | ICD-10-CM | POA: Diagnosis not present

## 2019-10-09 DIAGNOSIS — H40219 Acute angle-closure glaucoma, unspecified eye: Secondary | ICD-10-CM | POA: Diagnosis not present

## 2019-10-09 DIAGNOSIS — K5900 Constipation, unspecified: Secondary | ICD-10-CM | POA: Diagnosis not present

## 2019-10-09 DIAGNOSIS — G20 Parkinson's disease: Secondary | ICD-10-CM | POA: Diagnosis not present

## 2019-10-09 DIAGNOSIS — M6281 Muscle weakness (generalized): Secondary | ICD-10-CM | POA: Diagnosis not present

## 2019-10-09 DIAGNOSIS — R296 Repeated falls: Secondary | ICD-10-CM | POA: Diagnosis not present

## 2019-10-09 DIAGNOSIS — I10 Essential (primary) hypertension: Secondary | ICD-10-CM | POA: Diagnosis not present

## 2019-10-09 DIAGNOSIS — I4891 Unspecified atrial fibrillation: Secondary | ICD-10-CM | POA: Diagnosis not present

## 2019-10-09 DIAGNOSIS — G500 Trigeminal neuralgia: Secondary | ICD-10-CM | POA: Diagnosis not present

## 2019-10-09 DIAGNOSIS — E559 Vitamin D deficiency, unspecified: Secondary | ICD-10-CM | POA: Diagnosis not present

## 2019-10-09 DIAGNOSIS — G8929 Other chronic pain: Secondary | ICD-10-CM | POA: Diagnosis not present

## 2019-10-09 DIAGNOSIS — F329 Major depressive disorder, single episode, unspecified: Secondary | ICD-10-CM | POA: Diagnosis not present

## 2019-10-16 DIAGNOSIS — G8929 Other chronic pain: Secondary | ICD-10-CM | POA: Diagnosis not present

## 2019-10-16 DIAGNOSIS — G500 Trigeminal neuralgia: Secondary | ICD-10-CM | POA: Diagnosis not present

## 2019-10-18 DIAGNOSIS — K5900 Constipation, unspecified: Secondary | ICD-10-CM | POA: Diagnosis not present

## 2019-10-18 DIAGNOSIS — F330 Major depressive disorder, recurrent, mild: Secondary | ICD-10-CM | POA: Diagnosis not present

## 2019-10-18 DIAGNOSIS — K219 Gastro-esophageal reflux disease without esophagitis: Secondary | ICD-10-CM | POA: Diagnosis not present

## 2019-10-18 DIAGNOSIS — I4891 Unspecified atrial fibrillation: Secondary | ICD-10-CM | POA: Diagnosis not present

## 2019-10-18 DIAGNOSIS — M6281 Muscle weakness (generalized): Secondary | ICD-10-CM | POA: Diagnosis not present

## 2019-10-18 DIAGNOSIS — R296 Repeated falls: Secondary | ICD-10-CM | POA: Diagnosis not present

## 2019-10-18 DIAGNOSIS — H409 Unspecified glaucoma: Secondary | ICD-10-CM | POA: Diagnosis not present

## 2019-10-18 DIAGNOSIS — G500 Trigeminal neuralgia: Secondary | ICD-10-CM | POA: Diagnosis not present

## 2019-11-13 DIAGNOSIS — I10 Essential (primary) hypertension: Secondary | ICD-10-CM | POA: Diagnosis not present

## 2019-11-13 DIAGNOSIS — K5900 Constipation, unspecified: Secondary | ICD-10-CM | POA: Diagnosis not present

## 2019-11-13 DIAGNOSIS — I4891 Unspecified atrial fibrillation: Secondary | ICD-10-CM | POA: Diagnosis not present

## 2019-11-13 DIAGNOSIS — R296 Repeated falls: Secondary | ICD-10-CM | POA: Diagnosis not present

## 2019-11-13 DIAGNOSIS — F330 Major depressive disorder, recurrent, mild: Secondary | ICD-10-CM | POA: Diagnosis not present

## 2019-11-13 DIAGNOSIS — G8929 Other chronic pain: Secondary | ICD-10-CM | POA: Diagnosis not present

## 2019-11-13 DIAGNOSIS — M6281 Muscle weakness (generalized): Secondary | ICD-10-CM | POA: Diagnosis not present

## 2019-11-13 DIAGNOSIS — G500 Trigeminal neuralgia: Secondary | ICD-10-CM | POA: Diagnosis not present

## 2019-11-13 DIAGNOSIS — G20 Parkinson's disease: Secondary | ICD-10-CM | POA: Diagnosis not present

## 2019-11-13 DIAGNOSIS — K219 Gastro-esophageal reflux disease without esophagitis: Secondary | ICD-10-CM | POA: Diagnosis not present

## 2019-11-13 DIAGNOSIS — H409 Unspecified glaucoma: Secondary | ICD-10-CM | POA: Diagnosis not present

## 2019-11-13 DIAGNOSIS — E559 Vitamin D deficiency, unspecified: Secondary | ICD-10-CM | POA: Diagnosis not present

## 2019-11-15 ENCOUNTER — Emergency Department (HOSPITAL_COMMUNITY): Payer: PPO

## 2019-11-15 ENCOUNTER — Emergency Department (HOSPITAL_COMMUNITY)
Admission: EM | Admit: 2019-11-15 | Discharge: 2019-11-15 | Disposition: A | Discharge status: Home / Self Care | Payer: PPO | Attending: Emergency Medicine | Admitting: Emergency Medicine

## 2019-11-15 DIAGNOSIS — Z950 Presence of cardiac pacemaker: Secondary | ICD-10-CM | POA: Insufficient documentation

## 2019-11-15 DIAGNOSIS — I10 Essential (primary) hypertension: Secondary | ICD-10-CM | POA: Insufficient documentation

## 2019-11-15 DIAGNOSIS — G20 Parkinson's disease: Secondary | ICD-10-CM | POA: Insufficient documentation

## 2019-11-15 DIAGNOSIS — W133XXA Fall through floor, initial encounter: Secondary | ICD-10-CM | POA: Diagnosis not present

## 2019-11-15 DIAGNOSIS — R404 Transient alteration of awareness: Secondary | ICD-10-CM | POA: Diagnosis not present

## 2019-11-15 DIAGNOSIS — S4992XA Unspecified injury of left shoulder and upper arm, initial encounter: Secondary | ICD-10-CM | POA: Diagnosis present

## 2019-11-15 DIAGNOSIS — Y92092 Bedroom in other non-institutional residence as the place of occurrence of the external cause: Secondary | ICD-10-CM | POA: Insufficient documentation

## 2019-11-15 DIAGNOSIS — S42212A Unspecified displaced fracture of surgical neck of left humerus, initial encounter for closed fracture: Secondary | ICD-10-CM

## 2019-11-15 DIAGNOSIS — R0902 Hypoxemia: Secondary | ICD-10-CM | POA: Diagnosis not present

## 2019-11-15 DIAGNOSIS — M25519 Pain in unspecified shoulder: Secondary | ICD-10-CM | POA: Diagnosis not present

## 2019-11-15 DIAGNOSIS — Z79899 Other long term (current) drug therapy: Secondary | ICD-10-CM | POA: Diagnosis not present

## 2019-11-15 DIAGNOSIS — M25512 Pain in left shoulder: Secondary | ICD-10-CM | POA: Diagnosis not present

## 2019-11-15 DIAGNOSIS — Y999 Unspecified external cause status: Secondary | ICD-10-CM | POA: Insufficient documentation

## 2019-11-15 DIAGNOSIS — W19XXXA Unspecified fall, initial encounter: Secondary | ICD-10-CM | POA: Diagnosis not present

## 2019-11-15 DIAGNOSIS — R52 Pain, unspecified: Secondary | ICD-10-CM | POA: Diagnosis not present

## 2019-11-15 DIAGNOSIS — Y9389 Activity, other specified: Secondary | ICD-10-CM | POA: Diagnosis not present

## 2019-11-15 MED ORDER — FENTANYL CITRATE (PF) 100 MCG/2ML IJ SOLN
25.0000 ug | Freq: Once | INTRAMUSCULAR | Status: AC
  Administered 2019-11-15: 25 ug via INTRAVENOUS
  Filled 2019-11-15: qty 2

## 2019-11-15 MED ORDER — HYDROCODONE-ACETAMINOPHEN 5-325 MG PO TABS: 1.0000 | ORAL_TABLET | Freq: Four times a day (QID) | ORAL | 0 refills | Status: DC | PRN

## 2019-11-15 MED ORDER — HYDROCODONE-ACETAMINOPHEN 5-325 MG PO TABS: 2.0000 | ORAL_TABLET | ORAL | 0 refills | Status: DC | PRN

## 2019-11-15 NOTE — ED Notes (Signed)
Xray at beside

## 2019-11-15 NOTE — ED Provider Notes (Signed)
[facility]-EMERGENCY DEPT Provider Note   CSN: [id number] Arrival date & time: 11/15/19  1224     History No chief complaint on file.   Rayyan Orsborn is a 86 y.o. female with pertinent past medical history of A. fib, arthritis, hypertension mitral regurg and advanced Parkinson's disease that presents the emergency department today for fall.  Patient is speaks [place], son was used as Equities trader.  Declined medical interpreter.  Patient was brought in via EMS from [facility] medicine at home nursing, they state that she was reaching out to her wardrobe to put away her yarn and fell on the floor and landed on her left shoulder.  When speaking to the patient, patient states that she did not put out her wrist.  States that the first thing that hit the floor was her shoulder.  Triage nurse states that she was complaining of left wrist pain, however when speaking to the patient she denies pain in her wrist area.  Has never had injury to left shoulder before.  Denies any blood thinners.  Denies any injury to her head, no LOC.  Denies back injury, abdominal injury, pelvic injury.  Patient states that the only thing she hit was her shoulder.  Denies pain elsewhere.  Denies any numbness or tingling down her arm.  States that she is not able to move her arm.  HPI     Past Medical History:  Diagnosis Date  . A-fib (HCC)    PAROXYSMAL  . Abnormal electrocardiogram   . Anxiety   . Aortic regurgitation   . Arthritis   . Depression   . Essential hypertension 02/05/2018  . Hypertension   . Hypomagnesemia 02/05/2018  . Lumbar compression fracture (HCC) 01/31/2018  . Mitral regurgitation   . Nonrheumatic mitral (valve) insufficiency   . Parkinson's disease [facility])     Patient Active Problem List   Diagnosis Date Noted  . Hypomagnesemia 02/05/2018  . Depression 02/05/2018  . Essential hypertension 02/05/2018  . Parkinson's disease (HCC) 02/05/2018  . Lumbar compression fracture  (HCC) 01/31/2018    Past Surgical History:  Procedure Laterality Date  . PACEMAKER INSERTION  10/2013     OB History   No obstetric history on file.     Family History  Problem Relation Age of Onset  . Heart failure Mother   . Heart attack Father     Social History   Tobacco Use  . Smoking status: Never Smoker  . Smokeless tobacco: Never Used  Vaping Use  . Vaping Use: Never used  Substance Use Topics  . Alcohol use: Never  . Drug use: Never    Home Medications Prior to Admission medications   Medication Sig Start Date End Date Taking? Authorizing Provider  acetaminophen (TYLENOL) 500 MG tablet Take 500 mg by mouth every 6 (six) hours as needed.    [provider]  calcium-vitamin D (OSCAL WITH D) 500-200 MG-UNIT tablet Take 1 tablet by mouth daily with breakfast. 02/06/18   Marguerita Merles Latif, DO  Cholecalciferol (VITAMIN D3) 50 MCG (2000 UT) TABS Take 50 mcg by mouth daily.    [provider]  citalopram (CELEXA) 20 MG tablet Take 20 mg by mouth at bedtime.    [provider]  lisinopril (ZESTRIL) 5 MG tablet Take 1 tablet (5 mg total) by mouth daily. 02/27/19 05/28/19  Little Ishikawa, MD  metoprolol succinate (TOPROL-XL) 25 MG 24 hr tablet Take 25 mg by mouth daily.    [provider]  omeprazole (PRILOSEC) 20 MG capsule Take 20 mg by mouth daily.    [provider]  ondansetron (ZOFRAN) 4 MG tablet Take 1 tablet (4 mg total) by mouth every 6 (six) hours as needed for nausea. 02/06/18   Sheikh, Omair Latif, DO  pramipexole (MIRAPEX) 0.25 MG tablet Take 0.25 mg by mouth 3 (three) times daily.    [provider]  pregabalin (LYRICA) 75 MG capsule Take 100 mg by mouth every 8 (eight) hours.     [provider]  senna-docusate (SENOKOT-S) 8.6-50 MG tablet Take 1 tablet by mouth 2 (two) times daily. 02/06/18   Sheikh, Kateri Mcmair Latif, DO  traMADol (ULTRAM) 50 MG tablet Take 1 tablet (50 mg total) by mouth 3  (three) times daily as needed for moderate pain. 02/06/18   Marguerita MerlesSheikh, Omair Latif, DO    Allergies    Carbamazepine  Review of Systems   Review of Systems  Constitutional: Negative for chills, diaphoresis, fatigue and fever.  HENT: Negative for congestion, sore throat and trouble swallowing.   Eyes: Negative for pain and visual disturbance.  Respiratory: Negative for cough, shortness of breath and wheezing.   Cardiovascular: Negative for chest pain, palpitations and leg swelling.  Gastrointestinal: Negative for abdominal distention, abdominal pain, diarrhea, nausea and vomiting.  Genitourinary: Negative for difficulty urinating.  Musculoskeletal: Positive for arthralgias (L shoulder). Negative for back pain, neck pain and neck stiffness.  Skin: Negative for pallor.  Neurological: Negative for dizziness, speech difficulty, weakness and headaches.  Psychiatric/Behavioral: Negative for confusion.    Physical Exam Updated Vital Signs BP (!) 181/102   Pulse 76   Temp 97.8 F (36.6 C)   Resp 19   SpO2 96%   Physical Exam Constitutional:      General: She is not in acute distress.    Appearance: Normal appearance. She is not ill-appearing, toxic-appearing or diaphoretic.  HENT:     Head: Normocephalic and atraumatic.     Mouth/Throat:     Mouth: Mucous membranes are moist.     Pharynx: Oropharynx is clear.  Eyes:     General: No scleral icterus.    Extraocular Movements: Extraocular movements intact.     Pupils: Pupils are equal, round, and reactive to light.  Cardiovascular:     Rate and Rhythm: Normal rate and regular rhythm.     Pulses: Normal pulses.     Heart sounds: Normal heart sounds.  Pulmonary:     Effort: Pulmonary effort is normal. No respiratory distress.     Breath sounds: Normal breath sounds. No stridor. No wheezing, rhonchi or rales.  Chest:     Chest wall: No tenderness.  Abdominal:     General: Abdomen is flat. There is no distension.     Palpations:  Abdomen is soft.     Tenderness: There is no abdominal tenderness. There is no guarding or rebound.  Musculoskeletal:        General: Swelling, tenderness and deformity present. Normal range of motion.     Cervical back: Normal range of motion and neck supple. No rigidity.     Right lower leg: No edema.     Left lower leg: No edema.     Comments: Left shoulder with tenderness to entire shoulder joint.  Patient with tenderness down up into mid arm.  No elbow tenderness.  No wrist tenderness.  Normal sensation throughout.  Cap refill normal.  Radial pulse 2+.  Patient is not able to range her arm due  to pain.    Skin:    General: Skin is warm and dry.     Capillary Refill: Capillary refill takes less than 2 seconds.     Coloration: Skin is not pale.  Neurological:     General: No focal deficit present.     Mental Status: She is alert and oriented to person, place, and time.     Comments: Alert and oriented to person, place patient thinks that it is July 7.  Clear speech. No facial droop. CNIII-XII grossly intact. Bilateral upper and lower extremities' sensation grossly intact. 5/5 symmetric strength with grip strength and with plantar and dorsi flexion bilaterally.   Psychiatric:        Mood and Affect: Mood normal.        Behavior: Behavior normal.     ED Results / Procedures / Treatments   Labs (all labs ordered are listed, but only abnormal results are displayed) Labs Reviewed - No data to display  EKG None  Radiology DG Shoulder Left Portable  Result Date: 11/15/2019 CLINICAL DATA:  Left shoulder pain EXAM: LEFT SHOULDER COMPARISON:  None. FINDINGS: Three view radiograph of the left shoulder demonstrates an acute transverse fracture of the surgical neck of the humerus with 1/2 shaft with anterior displacement of the distal fracture fragment and mild medial angulation of the distal fracture fragment. The left humeral head is still seated within the glenoid fossa. Clavicle and  scapula appear intact. IMPRESSION: Displaced, minimally angulated left proximal humeral surgical neck fracture. Electronically Signed   By: Helyn Numbers MD   On: 11/15/2019 14:39    Procedures Procedures (including critical care time)  Medications Ordered in ED Medications  fentaNYL (SUBLIMAZE) injection 25 mcg (25 mcg Intravenous Given 11/15/19 1354)  fentaNYL (SUBLIMAZE) injection 25 mcg (25 mcg Intravenous Given 11/15/19 1526)    ED Course  I have reviewed the triage vital signs and the nursing notes.  Pertinent labs & imaging results that were available during my care of the patient were reviewed by me and considered in my medical decision making (see chart for details).    MDM Rules/Calculators/A&P                         Jaelee Laughter is a 86 y.o. female with pertinent past medical history of A. fib, arthritis, hypertension mitral regurg and advanced Parkinson's disease that presents the emergency department today for fall.  Patient with fall to left shoulder.  DG shoulder x-ray shows displaced angulated left proximal humeral surgical neck fracture.  Patient is distally neurovascularly intact.  Pain is being controlled with fentanyl. Spoke to Ortho, Dr. Magnus Ivan to ensure follow-up.  He states that they need to call on Monday to be seen on Monday.  Doubt need for further emergent work up at this time. I explained the diagnosis and have given explicit precautions to return to the ER including for any other new or worsening symptoms. The patient understands and accepts the medical plan as it's been dictated and I have answered their questions. Discharge instructions concerning home care and prescriptions have been given. The patient is STABLE and is discharged to home in good condition.  Blood pressure was elevated in the ER today, this is most likely due to pain.  Did discuss this with son, he states that he will have patient follow-up with primary care.  No chest pain or shortness of  breath.  After reevaluation, patient states that pain feels better.  I discussed this case with my attending physician who cosigned this note including patient's presenting symptoms, physical exam, and planned diagnostics and interventions. Attending physician stated agreement with plan or made changes to plan which were implemented.   Attending physician assessed patient at bedside.  Final Clinical Impression(s) / ED Diagnoses Final diagnoses:  Closed displaced fracture of surgical neck of left humerus, unspecified fracture morphology, initial encounter    Rx / DC Orders ED Discharge Orders    None       Farrel Gordon, PA-C 11/15/19 1604    Gerhard Munch, MD 11/15/19 1720

## 2019-11-15 NOTE — ED Triage Notes (Addendum)
[facility] transported pt from [facility] to [facility] and reports the following:  Pt was reaching into her wardrobe, fell hard on floor and landed on left shoulder. Complains of 9/10 left shoulder pain, unable to lift arm, no crepitus upon palpation. Complains of left wrist pain and motor weakness due to pain.   Son is on the way. He said she confused at baseline.

## 2019-11-15 NOTE — Discharge Instructions (Addendum)
You were seen today for a humerus fracture.  I want you to call [facility], Dr. Magnus Ivan above on Monday.  I want you to take Tylenol as prescribed on the bottle for pain, you can take Norco for breakthrough pain as needed.  Come back to the emergency department for any new or worsening concerning symptoms such as numbness and tingling, blue fingers, I want you to use the attached guide.

## 2019-11-18 DIAGNOSIS — G8929 Other chronic pain: Secondary | ICD-10-CM | POA: Diagnosis not present

## 2019-11-18 DIAGNOSIS — S42392A Other fracture of shaft of left humerus, initial encounter for closed fracture: Secondary | ICD-10-CM | POA: Diagnosis not present

## 2019-11-25 ENCOUNTER — Ambulatory Visit: Payer: PPO | Admitting: Physician Assistant

## 2019-11-25 ENCOUNTER — Encounter: Payer: Self-pay | Admitting: Physician Assistant

## 2019-11-25 ENCOUNTER — Other Ambulatory Visit: Payer: Self-pay

## 2019-11-25 DIAGNOSIS — G8929 Other chronic pain: Secondary | ICD-10-CM | POA: Diagnosis not present

## 2019-11-25 DIAGNOSIS — S42302A Unspecified fracture of shaft of humerus, left arm, initial encounter for closed fracture: Secondary | ICD-10-CM

## 2019-11-25 DIAGNOSIS — S51812A Laceration without foreign body of left forearm, initial encounter: Secondary | ICD-10-CM | POA: Diagnosis not present

## 2019-11-25 NOTE — Progress Notes (Signed)
Office Visit Note   Patient: Teresa Bates           Date of Birth: 1929/07/04           MRN: [id number] Visit Date: 11/25/2019              Requested by: [place] [id number] [place],  [place] [address] PCP: Rosetta Posner, MD   Assessment & Plan: Visit Diagnoses:  1. Closed fracture of shaft of left humerus, unspecified fracture morphology, initial encounter     Plan: She is to come out of the sling for gentle range of motion the elbow wrist and hand exercises shown discussed with patient and her son who is present today.  Also filled out she to go back to the skilled facility that she resides at.  She can also take sling off for bathing purposes.  Left upper extremity nonweightbearing.  Xeroform and Tegaderm was applied over the forearm abrasion.  Extra supplies given for this.  She will change the dressing every 2 days or as needed.  Follow-up with [place] in 2 weeks for AP lateral views left proximal humerus Questions were encouraged and answered at length. Follow-Up Instructions: Return in about 2 weeks (around 12/09/2019) for Radiographs.   Orders:  No orders of the defined types were placed in this encounter.  No orders of the defined types were placed in this encounter.     Procedures: No procedures performed   Clinical Data: No additional findings.   Subjective: Chief Complaint  Patient presents with  . Left Shoulder - Fracture    HPI Teresa Bates is a pleasant 86 year old female were seen for the first time referred by Teresa Bates, ER for left proximal humeral shaft fracture.  Patient is 86 year old female with advanced Parkinson's disease A. fib hypertension and mitral regurgitation.  She had a fall on 11/15/2019 and sustained the proximal humeral shaft fracture.  She had no loss of consciousness no dizziness.  She is accompanied by her son due to the fact that she speaks Spanish and also an interpreter. Patient was given a sling which she  has been wearing.  She also had an abrasion over the arm the bandage was applied to.  Radiographs left shoulder dated 11/15/2019 are reviewed.  These show a minimally displaced and angulated proximal humeral shaft / surgical neck fracture. Review of Systems See HPI.  Objective: Vital Signs: There were no vitals taken for this visit.  Physical Exam Constitutional:      Appearance: She is not ill-appearing or diaphoretic.  Pulmonary:     Effort: Pulmonary effort is normal.  Neurological:     Mental Status: She is alert and oriented to person, place, and time.     Ortho Exam Gentle range of motion of the left shoulder reveals fluid motion no extreme attempts of overhead motion or internal or external rotation attempted.  Significant ecchymosis down the arm into the proximal left forearm.  She has 2 abrasions over the left forearm, no signs of infection.  Left hands full sensation to light touch.  Full motor. Specialty Comments:  No specialty comments available.  Imaging: No results found.   PMFS History: Patient Active Problem List   Diagnosis Date Noted  . Closed fracture of shaft of left humerus 11/25/2019  . Hypomagnesemia 02/05/2018  . Depression 02/05/2018  . Essential hypertension 02/05/2018  . Parkinson's disease (HCC) 02/05/2018  . Lumbar compression fracture (HCC) 01/31/2018   Past Medical History:  Diagnosis  Date  . A-fib (HCC)    PAROXYSMAL  . Abnormal electrocardiogram   . Anxiety   . Aortic regurgitation   . Arthritis   . Depression   . Essential hypertension 02/05/2018  . Hypertension   . Hypomagnesemia 02/05/2018  . Lumbar compression fracture (HCC) 01/31/2018  . Mitral regurgitation   . Nonrheumatic mitral (valve) insufficiency   . Parkinson's disease (HCC)     Family History  Problem Relation Age of Onset  . Heart failure Mother   . Heart attack Father     Past Surgical History:  Procedure Laterality Date  . PACEMAKER INSERTION  10/2013   Social  History   Occupational History  . Not on file  Tobacco Use  . Smoking status: Never Smoker  . Smokeless tobacco: Never Used  Vaping Use  . Vaping Use: Never used  Substance and Sexual Activity  . Alcohol use: Never  . Drug use: Never  . Sexual activity: Not on file

## 2019-11-27 DIAGNOSIS — S51812A Laceration without foreign body of left forearm, initial encounter: Secondary | ICD-10-CM | POA: Diagnosis not present

## 2019-11-27 DIAGNOSIS — S42302A Unspecified fracture of shaft of humerus, left arm, initial encounter for closed fracture: Secondary | ICD-10-CM | POA: Diagnosis not present

## 2019-11-28 DIAGNOSIS — M6281 Muscle weakness (generalized): Secondary | ICD-10-CM | POA: Diagnosis not present

## 2019-11-28 DIAGNOSIS — R2681 Unsteadiness on feet: Secondary | ICD-10-CM | POA: Diagnosis not present

## 2019-11-28 DIAGNOSIS — R2689 Other abnormalities of gait and mobility: Secondary | ICD-10-CM | POA: Diagnosis not present

## 2019-11-28 DIAGNOSIS — G20 Parkinson's disease: Secondary | ICD-10-CM | POA: Diagnosis not present

## 2019-11-28 DIAGNOSIS — S42202D Unspecified fracture of upper end of left humerus, subsequent encounter for fracture with routine healing: Secondary | ICD-10-CM | POA: Diagnosis not present

## 2019-12-09 ENCOUNTER — Ambulatory Visit (INDEPENDENT_AMBULATORY_CARE_PROVIDER_SITE_OTHER): Payer: PPO

## 2019-12-09 ENCOUNTER — Encounter: Payer: Self-pay | Admitting: Physician Assistant

## 2019-12-09 ENCOUNTER — Other Ambulatory Visit: Payer: Self-pay

## 2019-12-09 ENCOUNTER — Ambulatory Visit (INDEPENDENT_AMBULATORY_CARE_PROVIDER_SITE_OTHER): Payer: PPO | Admitting: Physician Assistant

## 2019-12-09 DIAGNOSIS — S42302A Unspecified fracture of shaft of humerus, left arm, initial encounter for closed fracture: Secondary | ICD-10-CM

## 2019-12-09 DIAGNOSIS — K5900 Constipation, unspecified: Secondary | ICD-10-CM | POA: Diagnosis not present

## 2019-12-09 DIAGNOSIS — M6281 Muscle weakness (generalized): Secondary | ICD-10-CM | POA: Diagnosis not present

## 2019-12-09 DIAGNOSIS — S42202D Unspecified fracture of upper end of left humerus, subsequent encounter for fracture with routine healing: Secondary | ICD-10-CM | POA: Diagnosis not present

## 2019-12-09 DIAGNOSIS — I4891 Unspecified atrial fibrillation: Secondary | ICD-10-CM | POA: Diagnosis not present

## 2019-12-09 DIAGNOSIS — G20 Parkinson's disease: Secondary | ICD-10-CM | POA: Diagnosis not present

## 2019-12-09 DIAGNOSIS — F330 Major depressive disorder, recurrent, mild: Secondary | ICD-10-CM | POA: Diagnosis not present

## 2019-12-09 DIAGNOSIS — G500 Trigeminal neuralgia: Secondary | ICD-10-CM | POA: Diagnosis not present

## 2019-12-09 DIAGNOSIS — F329 Major depressive disorder, single episode, unspecified: Secondary | ICD-10-CM | POA: Diagnosis not present

## 2019-12-09 DIAGNOSIS — K219 Gastro-esophageal reflux disease without esophagitis: Secondary | ICD-10-CM | POA: Diagnosis not present

## 2019-12-09 DIAGNOSIS — R2681 Unsteadiness on feet: Secondary | ICD-10-CM | POA: Diagnosis not present

## 2019-12-09 DIAGNOSIS — R2689 Other abnormalities of gait and mobility: Secondary | ICD-10-CM | POA: Diagnosis not present

## 2019-12-09 DIAGNOSIS — E559 Vitamin D deficiency, unspecified: Secondary | ICD-10-CM | POA: Diagnosis not present

## 2019-12-09 DIAGNOSIS — R296 Repeated falls: Secondary | ICD-10-CM | POA: Diagnosis not present

## 2019-12-09 DIAGNOSIS — H409 Unspecified glaucoma: Secondary | ICD-10-CM | POA: Diagnosis not present

## 2019-12-09 NOTE — Progress Notes (Signed)
Office Visit Note   Patient: Teresa Bates           Date of Birth: 1930-03-03           MRN: [id number] Visit Date: 12/09/2019              Requested by: [place] [id number] [place],  [place] [address] PCP: Rosetta Posner, MD   Assessment & Plan: Visit Diagnoses:  1. Closed fracture of shaft of left humerus, unspecified fracture morphology, initial encounter     Plan: She will continue to work on range of motion of the elbow forearm wrist and hand on the left.  Otherwise wearing a sling except when doing exercises and bathing.  In regards to the abrasions on the forearm these are healing well require no further treatment.  See her back in 4 weeks for repeat films of the left shoulder.  Discussed with her son and the patient he is a interpreter that she should heal this well however there will be some shortening of the left humerus.  But given her age and overall health would continue to treat this conservatively.  They are both in agreement and did not want to undergo any type of surgical procedure for the left humerus fracture.  Follow-Up Instructions: Return in about 4 weeks (around 01/06/2020) for Radiographs.   Orders:  Orders Placed This Encounter  Procedures   XR Shoulder Left   No orders of the defined types were placed in this encounter.     Procedures: No procedures performed   Clinical Data: No additional findings.   Subjective: Chief Complaint  Patient presents with   Left Shoulder - Follow-up    HPI Mrs.Armojo returns today for follow-up of her left proximal humerus fracture.  She states that her pain is diminishing.  She is working with occupational therapy.  Her son accompanies her today along with an interpreter she speaks [place].  Given her injury occurred on 11/15/2019. Review of Systems See HPI  Objective: Vital Signs: There were no vitals taken for this visit.  Physical Exam General: No acute distress. Ortho  Exam Left shoulder internal and external rotation reveals fluid motion without significant pain.  She has full range of motion left elbow forearm wrist and hand.  Full motor of the left hand.  Abrasions over the left forearm are healing well almost completely healed no signs of drainage or infection.  Specialty Comments:  No specialty comments available.  Imaging: XR Shoulder Left  Result Date: 12/09/2019 Left shoulder 3 views: Displacement of the proximal humeral shaft medially compared to the humeral head.  Humeral head remains well located within the glenohumeral joint space.  Significant shortening.  There is signs of early consolidation.    PMFS History: Patient Active Problem List   Diagnosis Date Noted   Closed fracture of shaft of left humerus 11/25/2019   Hypomagnesemia 02/05/2018   Depression 02/05/2018   Essential hypertension 02/05/2018   Parkinson's disease (HCC) 02/05/2018   Lumbar compression fracture (HCC) 01/31/2018   Past Medical History:  Diagnosis Date   A-fib [facility])    PAROXYSMAL   Abnormal electrocardiogram    Anxiety    Aortic regurgitation    Arthritis    Depression    Essential hypertension 02/05/2018   Hypertension    Hypomagnesemia 02/05/2018   Lumbar compression fracture (HCC) 01/31/2018   Mitral regurgitation    Nonrheumatic mitral (valve) insufficiency    Parkinson's disease (HCC)  Family History  Problem Relation Age of Onset   Heart failure Mother    Heart attack Father     Past Surgical History:  Procedure Laterality Date   PACEMAKER INSERTION  10/2013   Social History   Occupational History   Not on file  Tobacco Use   Smoking status: Never Smoker   Smokeless tobacco: Never Used  Vaping Use   Vaping Use: Never used  Substance and Sexual Activity   Alcohol use: Never   Drug use: Never   Sexual activity: Not on file

## 2019-12-10 ENCOUNTER — Telehealth: Payer: Self-pay | Admitting: Orthopaedic Surgery

## 2019-12-10 NOTE — Telephone Encounter (Signed)
Please advise 

## 2019-12-10 NOTE — Telephone Encounter (Signed)
Teresa Bates with [facility] needs [place] to fax over the pt weight restrictions for her arm please.  Alison Stalling Fax# [phone]

## 2019-12-11 NOTE — Telephone Encounter (Signed)
5 lbs with left arm

## 2019-12-11 NOTE — Telephone Encounter (Signed)
Faxed to provided number  

## 2019-12-18 ENCOUNTER — Ambulatory Visit (INDEPENDENT_AMBULATORY_CARE_PROVIDER_SITE_OTHER): Payer: PPO | Admitting: *Deleted

## 2019-12-18 DIAGNOSIS — I495 Sick sinus syndrome: Secondary | ICD-10-CM

## 2019-12-18 LAB — CUP PACEART REMOTE DEVICE CHECK
Battery Remaining Longevity: 101 mo
Battery Remaining Percentage: 95.5 %
Battery Voltage: 2.96 V
Brady Statistic AP VP Percent: 1.2 %
Brady Statistic AP VS Percent: 73 %
Brady Statistic AS VP Percent: 1 %
Brady Statistic AS VS Percent: 26 %
Brady Statistic RA Percent Paced: 72 %
Brady Statistic RV Percent Paced: 1.3 %
Date Time Interrogation Session: 20210811123016
Implantable Lead Implant Date: 20051022
Implantable Lead Implant Date: 20051022
Implantable Lead Location: 753859
Implantable Lead Location: 753860
Implantable Lead Model: 5076
Implantable Pulse Generator Implant Date: 20150601
Lead Channel Impedance Value: 380 Ohm
Lead Channel Impedance Value: 580 Ohm
Lead Channel Pacing Threshold Amplitude: 1 V
Lead Channel Pacing Threshold Amplitude: 1.5 V
Lead Channel Pacing Threshold Pulse Width: 0.4 ms
Lead Channel Pacing Threshold Pulse Width: 0.4 ms
Lead Channel Sensing Intrinsic Amplitude: 1.5 mV
Lead Channel Sensing Intrinsic Amplitude: 9.1 mV
Lead Channel Setting Pacing Amplitude: 1.75 V
Lead Channel Setting Pacing Amplitude: 2.5 V
Lead Channel Setting Pacing Pulse Width: 0.4 ms
Lead Channel Setting Sensing Sensitivity: 2 mV
Pulse Gen Model: 2240
Pulse Gen Serial Number: 7612285

## 2019-12-20 NOTE — Progress Notes (Signed)
Remote pacemaker transmission.   

## 2019-12-25 DIAGNOSIS — G894 Chronic pain syndrome: Secondary | ICD-10-CM | POA: Diagnosis not present

## 2019-12-27 ENCOUNTER — Telehealth: Payer: Self-pay | Admitting: *Deleted

## 2019-12-27 NOTE — Telephone Encounter (Signed)
-----   Message from Will Jorja Loa, MD sent at 12/23/2019  3:20 PM EDT ----- Abnormal device interrogation reviewed.  Lead parameters and battery status stable.  AT episodes. Increase toprol xl to 50 mg

## 2019-12-27 NOTE — Telephone Encounter (Signed)
lmtcb

## 2019-12-30 ENCOUNTER — Telehealth: Payer: Self-pay | Admitting: Cardiology

## 2019-12-30 MED ORDER — METOPROLOL SUCCINATE ER 25 MG PO TB24: 50.0000 mg | ORAL_TABLET | Freq: Every day | ORAL | 3 refills | Status: DC

## 2019-12-30 NOTE — Telephone Encounter (Signed)
Abnormal device interrogation reviewed.  Lead parameters and battery status stable.  AT episodes. Increase toprol xl to 50 mg  Called back and left a message for the patients son, Sherilyn Cooter regarding the above instructions from Dr. Elberta Fortis. New order sent to [facility] in [place] for 50 mg daily Toprol XL.

## 2019-12-30 NOTE — Telephone Encounter (Signed)
Thyra Breed is returning [employer]. He states if you are unable to reach him when calling back please leave a detailed message. Please advise.

## 2019-12-30 NOTE — Telephone Encounter (Signed)
Follow up    Pt's son is returning call from [facility] said he just want to let Dr. Elberta Fortis know, he got the message about pt's metoprolol increasing from 25 mg to 50 mg. He said pt had a fall this month, so pt's bp elevating might be because of her fall.

## 2019-12-30 NOTE — Telephone Encounter (Signed)
Left detailed message for the patients son, Thyra Breed per his request.

## 2019-12-30 NOTE — Telephone Encounter (Signed)
Patients son , Thyra Breed, calling back

## 2020-01-03 DIAGNOSIS — I4891 Unspecified atrial fibrillation: Secondary | ICD-10-CM | POA: Diagnosis not present

## 2020-01-03 DIAGNOSIS — S42302A Unspecified fracture of shaft of humerus, left arm, initial encounter for closed fracture: Secondary | ICD-10-CM | POA: Diagnosis not present

## 2020-01-03 DIAGNOSIS — K219 Gastro-esophageal reflux disease without esophagitis: Secondary | ICD-10-CM | POA: Diagnosis not present

## 2020-01-03 DIAGNOSIS — G500 Trigeminal neuralgia: Secondary | ICD-10-CM | POA: Diagnosis not present

## 2020-01-03 DIAGNOSIS — M6281 Muscle weakness (generalized): Secondary | ICD-10-CM | POA: Diagnosis not present

## 2020-01-03 DIAGNOSIS — G20 Parkinson's disease: Secondary | ICD-10-CM | POA: Diagnosis not present

## 2020-01-07 ENCOUNTER — Ambulatory Visit: Payer: PPO | Admitting: Orthopaedic Surgery

## 2020-01-08 DIAGNOSIS — H409 Unspecified glaucoma: Secondary | ICD-10-CM | POA: Diagnosis not present

## 2020-01-08 DIAGNOSIS — G20 Parkinson's disease: Secondary | ICD-10-CM | POA: Diagnosis not present

## 2020-01-08 DIAGNOSIS — K5900 Constipation, unspecified: Secondary | ICD-10-CM | POA: Diagnosis not present

## 2020-01-08 DIAGNOSIS — R296 Repeated falls: Secondary | ICD-10-CM | POA: Diagnosis not present

## 2020-01-08 DIAGNOSIS — H04123 Dry eye syndrome of bilateral lacrimal glands: Secondary | ICD-10-CM | POA: Diagnosis not present

## 2020-01-08 DIAGNOSIS — R2689 Other abnormalities of gait and mobility: Secondary | ICD-10-CM | POA: Diagnosis not present

## 2020-01-08 DIAGNOSIS — S42202D Unspecified fracture of upper end of left humerus, subsequent encounter for fracture with routine healing: Secondary | ICD-10-CM | POA: Diagnosis not present

## 2020-01-08 DIAGNOSIS — R2681 Unsteadiness on feet: Secondary | ICD-10-CM | POA: Diagnosis not present

## 2020-01-08 DIAGNOSIS — M6281 Muscle weakness (generalized): Secondary | ICD-10-CM | POA: Diagnosis not present

## 2020-01-08 DIAGNOSIS — K219 Gastro-esophageal reflux disease without esophagitis: Secondary | ICD-10-CM | POA: Diagnosis not present

## 2020-01-08 DIAGNOSIS — G500 Trigeminal neuralgia: Secondary | ICD-10-CM | POA: Diagnosis not present

## 2020-01-08 DIAGNOSIS — S42392A Other fracture of shaft of left humerus, initial encounter for closed fracture: Secondary | ICD-10-CM | POA: Diagnosis not present

## 2020-01-08 DIAGNOSIS — I4891 Unspecified atrial fibrillation: Secondary | ICD-10-CM | POA: Diagnosis not present

## 2020-01-09 DIAGNOSIS — E7849 Other hyperlipidemia: Secondary | ICD-10-CM | POA: Diagnosis not present

## 2020-01-09 DIAGNOSIS — Z79899 Other long term (current) drug therapy: Secondary | ICD-10-CM | POA: Diagnosis not present

## 2020-01-16 ENCOUNTER — Encounter: Payer: Self-pay | Admitting: Orthopaedic Surgery

## 2020-01-16 ENCOUNTER — Ambulatory Visit (INDEPENDENT_AMBULATORY_CARE_PROVIDER_SITE_OTHER): Payer: PPO

## 2020-01-16 ENCOUNTER — Ambulatory Visit: Payer: Self-pay

## 2020-01-16 ENCOUNTER — Ambulatory Visit: Payer: PPO | Admitting: Orthopaedic Surgery

## 2020-01-16 DIAGNOSIS — S42302D Unspecified fracture of shaft of humerus, left arm, subsequent encounter for fracture with routine healing: Secondary | ICD-10-CM | POA: Diagnosis not present

## 2020-01-16 DIAGNOSIS — M25511 Pain in right shoulder: Secondary | ICD-10-CM

## 2020-01-16 NOTE — Progress Notes (Signed)
The patient is an 86 year old female who is in follow-up about 8 weeks out from mechanical fall in which she sustained a left proximal humerus fracture.  She has been in a sling.  She is with an interpreter today as well as her son.  She reports right shoulder pain as well.  I took her out of the sling today and put her left shoulder through gentle range of motion.  She can actually hold it abducted position of 90 degrees.  It is weak and somewhat painful but it moves as a unit.  Her exam is much improved from previous exams.  Her right shoulder does show some grinding of the glenohumeral joint and weakness of the rotator cuff and evidence of rotator cuff arthropathy.  2 views of the right shoulder do confirm rotator cuff arthropathy with no fracture.  There is significant glenohumeral arthritic changes and a high riding humeral head.  2 views of the left shoulder show abundant callus formation at the fracture site with a 3 part proximal humerus fracture.  There is been significant interval healing since x-rays from 4 weeks prior.  At this point she can stop the sling.  Therapy at her facility can work on range of motion of her left shoulder as well as gentle strengthening.  She can even try ambulating with a walker with weightbearing through her shoulder.  All question concerns were answered and addressed.  I like see her back for final visit in 4 weeks with a single AP view of the left shoulder.

## 2020-01-28 DIAGNOSIS — L602 Onychogryphosis: Secondary | ICD-10-CM | POA: Diagnosis not present

## 2020-01-28 DIAGNOSIS — M21611 Bunion of right foot: Secondary | ICD-10-CM | POA: Diagnosis not present

## 2020-01-28 DIAGNOSIS — M21612 Bunion of left foot: Secondary | ICD-10-CM | POA: Diagnosis not present

## 2020-01-28 DIAGNOSIS — I739 Peripheral vascular disease, unspecified: Secondary | ICD-10-CM | POA: Diagnosis not present

## 2020-01-29 DIAGNOSIS — G894 Chronic pain syndrome: Secondary | ICD-10-CM | POA: Diagnosis not present

## 2020-01-29 DIAGNOSIS — G500 Trigeminal neuralgia: Secondary | ICD-10-CM | POA: Diagnosis not present

## 2020-02-10 DIAGNOSIS — K5900 Constipation, unspecified: Secondary | ICD-10-CM | POA: Diagnosis not present

## 2020-02-10 DIAGNOSIS — G894 Chronic pain syndrome: Secondary | ICD-10-CM | POA: Diagnosis not present

## 2020-02-10 DIAGNOSIS — K219 Gastro-esophageal reflux disease without esophagitis: Secondary | ICD-10-CM | POA: Diagnosis not present

## 2020-02-10 DIAGNOSIS — G20 Parkinson's disease: Secondary | ICD-10-CM | POA: Diagnosis not present

## 2020-02-10 DIAGNOSIS — G500 Trigeminal neuralgia: Secondary | ICD-10-CM | POA: Diagnosis not present

## 2020-02-10 DIAGNOSIS — M6281 Muscle weakness (generalized): Secondary | ICD-10-CM | POA: Diagnosis not present

## 2020-02-10 DIAGNOSIS — I4891 Unspecified atrial fibrillation: Secondary | ICD-10-CM | POA: Diagnosis not present

## 2020-02-10 DIAGNOSIS — R296 Repeated falls: Secondary | ICD-10-CM | POA: Diagnosis not present

## 2020-02-13 ENCOUNTER — Ambulatory Visit: Payer: PPO | Admitting: Orthopaedic Surgery

## 2020-02-13 ENCOUNTER — Ambulatory Visit: Payer: Self-pay

## 2020-02-13 ENCOUNTER — Encounter: Payer: Self-pay | Admitting: Orthopaedic Surgery

## 2020-02-13 DIAGNOSIS — S42302D Unspecified fracture of shaft of humerus, left arm, subsequent encounter for fracture with routine healing: Secondary | ICD-10-CM | POA: Diagnosis not present

## 2020-02-13 NOTE — Progress Notes (Signed)
The patient is now 12 weeks status post injuring her left shoulder and sustaining a complex proximal humerus fracture.  She is 86 years old and stays in a nursing facility.  Her son who is with her states that they did a great job with therapy at the facility.  She is denying any shoulder issues at all and reports that she has some weakness but her pain is not as much as it was before.  4 weeks ago we took her out of her sling.  She has been able to reach overhead and behind her at this point.  On examination of her left shoulder does move his unit through internal and external rotation and abduction.  She tolerates this easily.  A single AP view of the left shoulder shows abundant callus formation from a healing proximal humerus fracture.  The shoulder is well located.  At this point follow-up can be as needed 60 is doing so well.  Activities can be increased as comfort allows with full weightbearing as tolerated.

## 2020-02-24 ENCOUNTER — Telehealth: Payer: Self-pay | Admitting: Cardiology

## 2020-02-24 NOTE — Telephone Encounter (Signed)
Left message for patient to call and schedule 6 month follow up with Dr. Schumann 

## 2020-03-03 DIAGNOSIS — R296 Repeated falls: Secondary | ICD-10-CM | POA: Diagnosis not present

## 2020-03-03 DIAGNOSIS — K219 Gastro-esophageal reflux disease without esophagitis: Secondary | ICD-10-CM | POA: Diagnosis not present

## 2020-03-03 DIAGNOSIS — H409 Unspecified glaucoma: Secondary | ICD-10-CM | POA: Diagnosis not present

## 2020-03-03 DIAGNOSIS — I4891 Unspecified atrial fibrillation: Secondary | ICD-10-CM | POA: Diagnosis not present

## 2020-03-03 DIAGNOSIS — G500 Trigeminal neuralgia: Secondary | ICD-10-CM | POA: Diagnosis not present

## 2020-03-03 DIAGNOSIS — K5900 Constipation, unspecified: Secondary | ICD-10-CM | POA: Diagnosis not present

## 2020-03-05 ENCOUNTER — Telehealth: Payer: Self-pay

## 2020-03-05 NOTE — Telephone Encounter (Signed)
[facility] alert received 03/05/20.  There was one atrial arrhythmia that was less than 5 minutes.  There were six ventricular arrhythmias detected.  They appear to be supraventricular in nature.  The heart rate is 150-170 bpm.  One episode was 25.5 minutes.    Spoke to patients son (DPR, Thyra Breed), states his mother has been doing well other than recent falls. States she is compliant with medications. States patient fx her arm in July, is in physical therapy, but still falls about once a week. States he thinks the falls are d/t balance issues. Patient is no longer on OAC.   Routing to Dr. Elberta Fortis for review and any recommendations.

## 2020-03-06 NOTE — Telephone Encounter (Signed)
If BP >120 systolic, increase toprol xl to 50 mg BID.

## 2020-03-06 NOTE — Telephone Encounter (Signed)
Prescription faxed and received confirmation.

## 2020-03-06 NOTE — Telephone Encounter (Signed)
Spoke to patients nurse [facility] where patient resides. States patients blood pressure this morning was 134/74. Randon Goldsmith of Dr. Elberta Fortis request on Toprol and that I will fax over order.   [facility] Phone number: [phone] Fax number: [phone]

## 2020-03-11 DIAGNOSIS — M6281 Muscle weakness (generalized): Secondary | ICD-10-CM | POA: Diagnosis not present

## 2020-03-11 DIAGNOSIS — H04123 Dry eye syndrome of bilateral lacrimal glands: Secondary | ICD-10-CM | POA: Diagnosis not present

## 2020-03-11 DIAGNOSIS — G20 Parkinson's disease: Secondary | ICD-10-CM | POA: Diagnosis not present

## 2020-03-11 DIAGNOSIS — K219 Gastro-esophageal reflux disease without esophagitis: Secondary | ICD-10-CM | POA: Diagnosis not present

## 2020-03-11 DIAGNOSIS — K5900 Constipation, unspecified: Secondary | ICD-10-CM | POA: Diagnosis not present

## 2020-03-11 DIAGNOSIS — F330 Major depressive disorder, recurrent, mild: Secondary | ICD-10-CM | POA: Diagnosis not present

## 2020-03-11 DIAGNOSIS — R2689 Other abnormalities of gait and mobility: Secondary | ICD-10-CM | POA: Diagnosis not present

## 2020-03-11 DIAGNOSIS — G500 Trigeminal neuralgia: Secondary | ICD-10-CM | POA: Diagnosis not present

## 2020-03-11 DIAGNOSIS — R296 Repeated falls: Secondary | ICD-10-CM | POA: Diagnosis not present

## 2020-03-11 DIAGNOSIS — H409 Unspecified glaucoma: Secondary | ICD-10-CM | POA: Diagnosis not present

## 2020-03-11 DIAGNOSIS — G894 Chronic pain syndrome: Secondary | ICD-10-CM | POA: Diagnosis not present

## 2020-03-11 DIAGNOSIS — I4891 Unspecified atrial fibrillation: Secondary | ICD-10-CM | POA: Diagnosis not present

## 2020-03-12 DIAGNOSIS — H25813 Combined forms of age-related cataract, bilateral: Secondary | ICD-10-CM | POA: Diagnosis not present

## 2020-03-12 DIAGNOSIS — H401134 Primary open-angle glaucoma, bilateral, indeterminate stage: Secondary | ICD-10-CM | POA: Diagnosis not present

## 2020-03-12 DIAGNOSIS — E569 Vitamin deficiency, unspecified: Secondary | ICD-10-CM | POA: Diagnosis not present

## 2020-03-12 DIAGNOSIS — H524 Presbyopia: Secondary | ICD-10-CM | POA: Diagnosis not present

## 2020-03-12 DIAGNOSIS — E559 Vitamin D deficiency, unspecified: Secondary | ICD-10-CM | POA: Diagnosis not present

## 2020-03-18 ENCOUNTER — Ambulatory Visit (INDEPENDENT_AMBULATORY_CARE_PROVIDER_SITE_OTHER): Payer: PPO

## 2020-03-18 DIAGNOSIS — I495 Sick sinus syndrome: Secondary | ICD-10-CM

## 2020-03-20 LAB — CUP PACEART REMOTE DEVICE CHECK
Battery Remaining Longevity: 95 mo
Battery Remaining Percentage: 89 %
Battery Voltage: 2.95 V
Brady Statistic AP VP Percent: 1 %
Brady Statistic AP VS Percent: 71 %
Brady Statistic AS VP Percent: 1 %
Brady Statistic AS VS Percent: 28 %
Brady Statistic RA Percent Paced: 70 %
Brady Statistic RV Percent Paced: 1.1 %
Date Time Interrogation Session: 20211111220832
Implantable Lead Implant Date: 20051022
Implantable Lead Implant Date: 20051022
Implantable Lead Location: 753859
Implantable Lead Location: 753860
Implantable Lead Model: 5076
Implantable Pulse Generator Implant Date: 20150601
Lead Channel Impedance Value: 380 Ohm
Lead Channel Impedance Value: 560 Ohm
Lead Channel Pacing Threshold Amplitude: 1 V
Lead Channel Pacing Threshold Amplitude: 1.75 V
Lead Channel Pacing Threshold Pulse Width: 0.4 ms
Lead Channel Pacing Threshold Pulse Width: 0.4 ms
Lead Channel Sensing Intrinsic Amplitude: 1.6 mV
Lead Channel Sensing Intrinsic Amplitude: 8.8 mV
Lead Channel Setting Pacing Amplitude: 2 V
Lead Channel Setting Pacing Amplitude: 2.5 V
Lead Channel Setting Pacing Pulse Width: 0.4 ms
Lead Channel Setting Sensing Sensitivity: 2 mV
Pulse Gen Model: 2240
Pulse Gen Serial Number: 7612285

## 2020-03-20 NOTE — Progress Notes (Signed)
Remote pacemaker transmission.   

## 2020-03-27 DIAGNOSIS — H35013 Changes in retinal vascular appearance, bilateral: Secondary | ICD-10-CM | POA: Diagnosis not present

## 2020-03-27 DIAGNOSIS — H401134 Primary open-angle glaucoma, bilateral, indeterminate stage: Secondary | ICD-10-CM | POA: Diagnosis not present

## 2020-03-27 DIAGNOSIS — H2513 Age-related nuclear cataract, bilateral: Secondary | ICD-10-CM | POA: Diagnosis not present

## 2020-03-27 DIAGNOSIS — H35363 Drusen (degenerative) of macula, bilateral: Secondary | ICD-10-CM | POA: Diagnosis not present

## 2020-04-14 DIAGNOSIS — H906 Mixed conductive and sensorineural hearing loss, bilateral: Secondary | ICD-10-CM | POA: Diagnosis not present

## 2020-05-12 ENCOUNTER — Telehealth: Payer: Self-pay | Admitting: *Deleted

## 2020-05-12 NOTE — Telephone Encounter (Signed)
A message was left, re: her follow up visit. 

## 2020-05-15 DIAGNOSIS — K5900 Constipation, unspecified: Secondary | ICD-10-CM | POA: Diagnosis not present

## 2020-05-15 DIAGNOSIS — I4891 Unspecified atrial fibrillation: Secondary | ICD-10-CM | POA: Diagnosis not present

## 2020-05-15 DIAGNOSIS — R296 Repeated falls: Secondary | ICD-10-CM | POA: Diagnosis not present

## 2020-05-15 DIAGNOSIS — G20 Parkinson's disease: Secondary | ICD-10-CM | POA: Diagnosis not present

## 2020-05-27 DIAGNOSIS — R079 Chest pain, unspecified: Secondary | ICD-10-CM | POA: Diagnosis not present

## 2020-05-27 DIAGNOSIS — I739 Peripheral vascular disease, unspecified: Secondary | ICD-10-CM | POA: Diagnosis not present

## 2020-05-27 DIAGNOSIS — M2042 Other hammer toe(s) (acquired), left foot: Secondary | ICD-10-CM | POA: Diagnosis not present

## 2020-05-27 DIAGNOSIS — B351 Tinea unguium: Secondary | ICD-10-CM | POA: Diagnosis not present

## 2020-05-27 DIAGNOSIS — M2041 Other hammer toe(s) (acquired), right foot: Secondary | ICD-10-CM | POA: Diagnosis not present

## 2020-06-02 ENCOUNTER — Encounter: Payer: Self-pay | Admitting: Cardiology

## 2020-06-09 DIAGNOSIS — W19XXXD Unspecified fall, subsequent encounter: Secondary | ICD-10-CM | POA: Diagnosis not present

## 2020-06-09 DIAGNOSIS — R0782 Intercostal pain: Secondary | ICD-10-CM | POA: Diagnosis not present

## 2020-06-17 ENCOUNTER — Ambulatory Visit (INDEPENDENT_AMBULATORY_CARE_PROVIDER_SITE_OTHER): Payer: PPO

## 2020-06-17 DIAGNOSIS — I495 Sick sinus syndrome: Secondary | ICD-10-CM | POA: Diagnosis not present

## 2020-06-18 LAB — CUP PACEART REMOTE DEVICE CHECK
Battery Remaining Longevity: 96 mo
Battery Remaining Percentage: 89 %
Battery Voltage: 2.95 V
Brady Statistic AP VP Percent: 1 %
Brady Statistic AP VS Percent: 70 %
Brady Statistic AS VP Percent: 1 %
Brady Statistic AS VS Percent: 29 %
Brady Statistic RA Percent Paced: 69 %
Brady Statistic RV Percent Paced: 1 %
Date Time Interrogation Session: 20220209020013
Implantable Lead Implant Date: 20051022
Implantable Lead Implant Date: 20051022
Implantable Lead Location: 753859
Implantable Lead Location: 753860
Implantable Lead Model: 5076
Implantable Pulse Generator Implant Date: 20150601
Lead Channel Impedance Value: 380 Ohm
Lead Channel Impedance Value: 580 Ohm
Lead Channel Pacing Threshold Amplitude: 1 V
Lead Channel Pacing Threshold Amplitude: 1.375 V
Lead Channel Pacing Threshold Pulse Width: 0.4 ms
Lead Channel Pacing Threshold Pulse Width: 0.4 ms
Lead Channel Sensing Intrinsic Amplitude: 1.4 mV
Lead Channel Sensing Intrinsic Amplitude: 9.7 mV
Lead Channel Setting Pacing Amplitude: 1.625
Lead Channel Setting Pacing Amplitude: 2.5 V
Lead Channel Setting Pacing Pulse Width: 0.4 ms
Lead Channel Setting Sensing Sensitivity: 2 mV
Pulse Gen Model: 2240
Pulse Gen Serial Number: 7612285

## 2020-06-23 NOTE — Progress Notes (Signed)
Remote pacemaker transmission.   

## 2020-07-17 IMAGING — CR DG LUMBAR SPINE 2-3V
3 series · 3 of 3 positions shown · non-contrast
Comparison: None.

CLINICAL DATA: Back pain for 2 months, abdominal pain for 1 month.
History of compression fractures.

EXAM:
LUMBAR SPINE - 2-3 VIEW

[t lumbar spine ap]
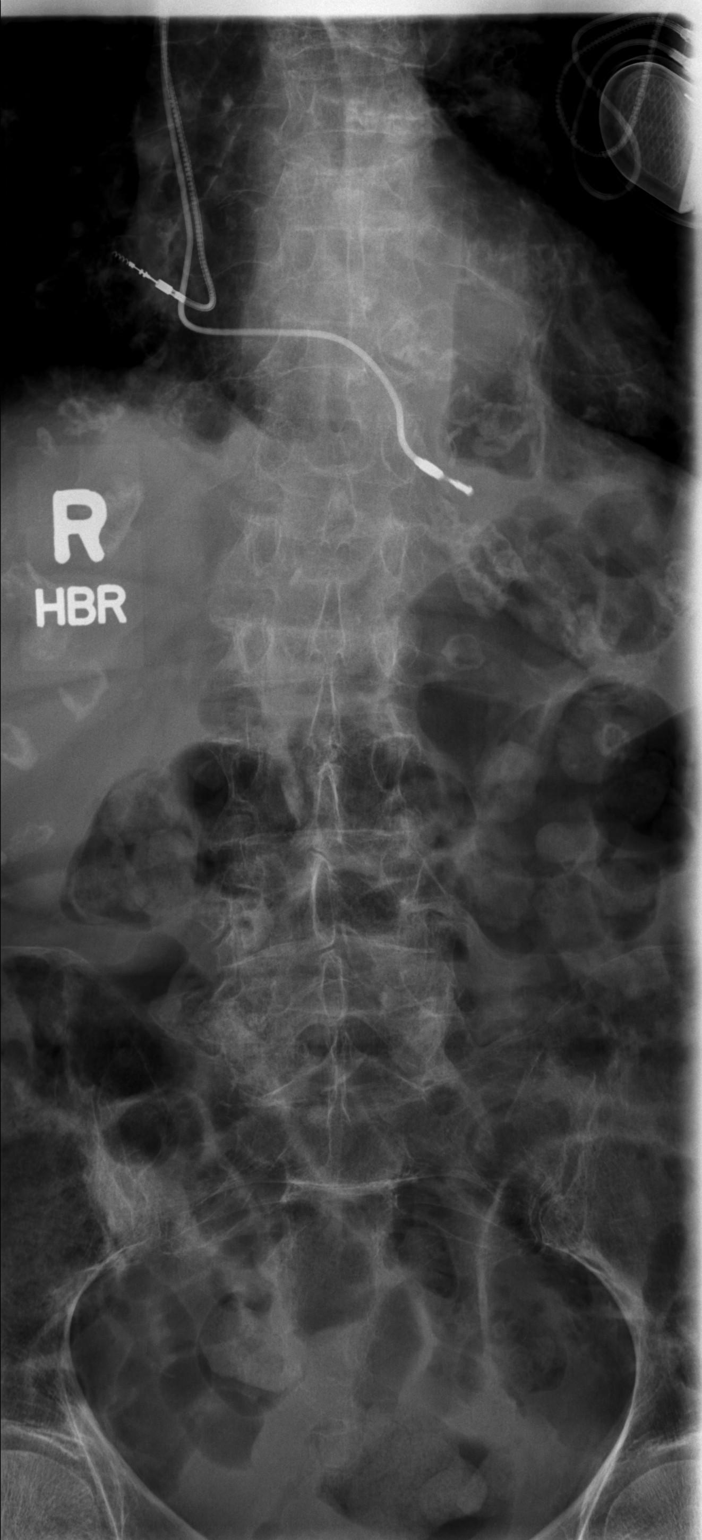

[t lumbar spine lat]
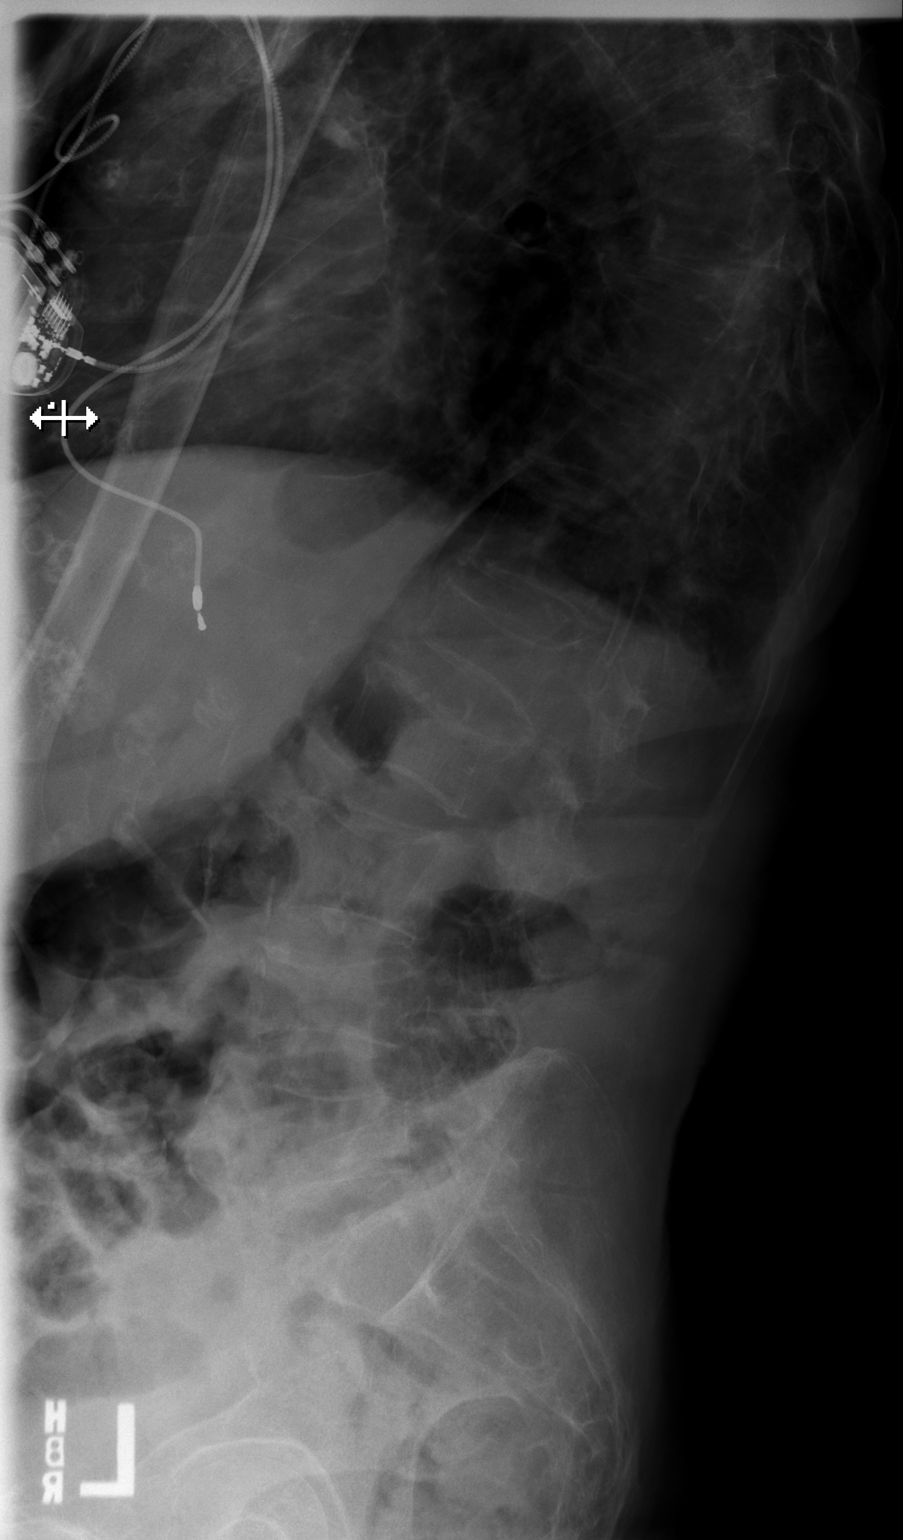

[t lumbar l-5 s-1 spot]
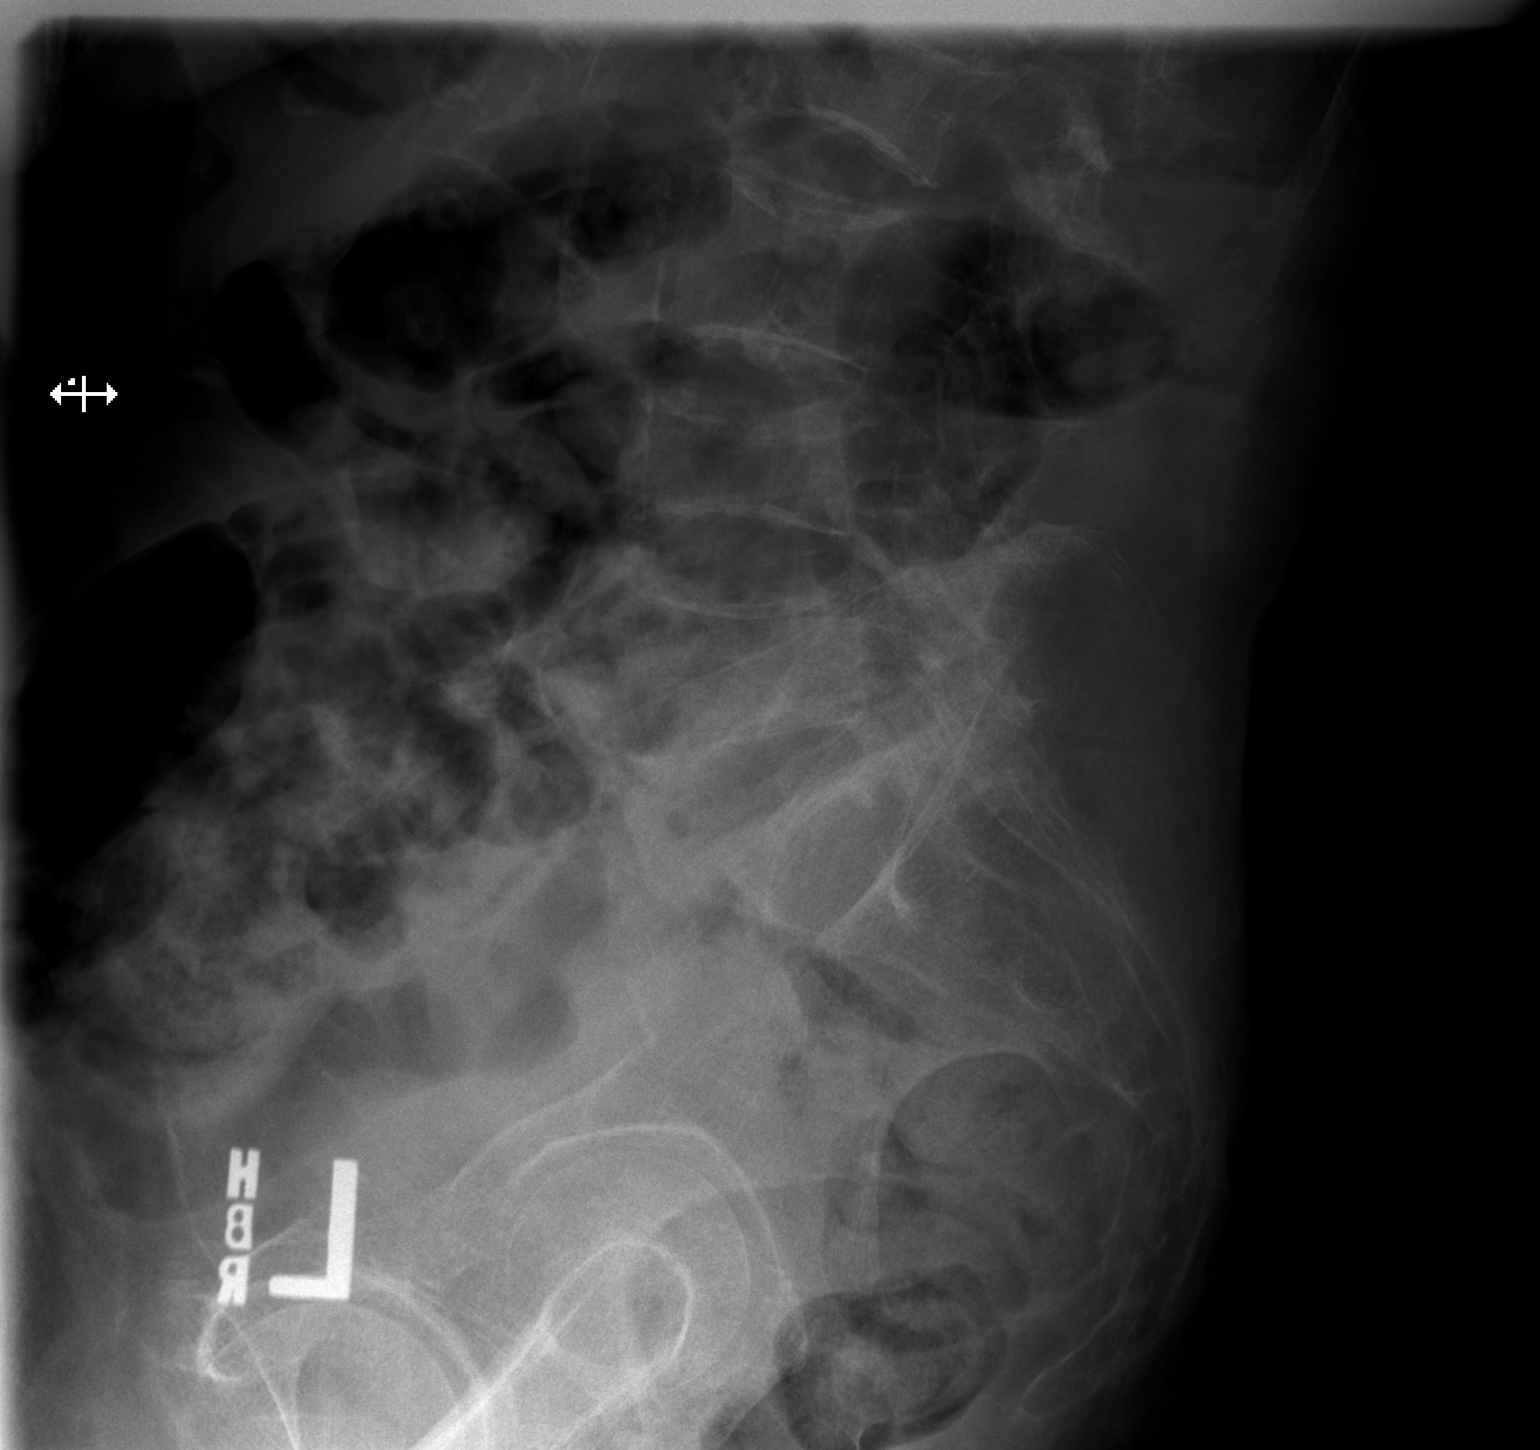

[3 of 3 positions shown; findings below may reference images not displayed]

FINDINGS: Limited assessment due to severe osteopenia. Moderate L1, moderate
L2, mild L3, moderate L4 and mild L5 compression fractures.
Additional thoracic compression fractures, severe at T9. No
malalignment. Maintenance of lumbar lordosis. Cardiac pacemaker
wires in place. Included prevertebral and paraspinal soft tissue
planes are non suspicious.
IMPRESSION: 1. Limited assessment due to severe osteopenia.
2. Age indeterminate compression fractures all lumbar levels,
moderate at L1 and L4. Severe T9 compression fracture.

## 2020-08-03 DIAGNOSIS — G20 Parkinson's disease: Secondary | ICD-10-CM | POA: Diagnosis not present

## 2020-08-03 DIAGNOSIS — R251 Tremor, unspecified: Secondary | ICD-10-CM | POA: Diagnosis not present

## 2020-08-04 DIAGNOSIS — B351 Tinea unguium: Secondary | ICD-10-CM | POA: Diagnosis not present

## 2020-08-04 DIAGNOSIS — I739 Peripheral vascular disease, unspecified: Secondary | ICD-10-CM | POA: Diagnosis not present

## 2020-08-04 DIAGNOSIS — M2041 Other hammer toe(s) (acquired), right foot: Secondary | ICD-10-CM | POA: Diagnosis not present

## 2020-08-04 DIAGNOSIS — M2042 Other hammer toe(s) (acquired), left foot: Secondary | ICD-10-CM | POA: Diagnosis not present

## 2020-08-26 DIAGNOSIS — G20 Parkinson's disease: Secondary | ICD-10-CM | POA: Diagnosis not present

## 2020-08-26 DIAGNOSIS — W050XXA Fall from non-moving wheelchair, initial encounter: Secondary | ICD-10-CM | POA: Diagnosis not present

## 2020-08-26 DIAGNOSIS — T148XXA Other injury of unspecified body region, initial encounter: Secondary | ICD-10-CM | POA: Diagnosis not present

## 2020-08-28 DIAGNOSIS — Z9181 History of falling: Secondary | ICD-10-CM | POA: Diagnosis not present

## 2020-08-28 DIAGNOSIS — M6281 Muscle weakness (generalized): Secondary | ICD-10-CM | POA: Diagnosis not present

## 2020-08-31 DIAGNOSIS — R531 Weakness: Secondary | ICD-10-CM | POA: Diagnosis not present

## 2020-08-31 DIAGNOSIS — Z79899 Other long term (current) drug therapy: Secondary | ICD-10-CM | POA: Diagnosis not present

## 2020-09-16 ENCOUNTER — Ambulatory Visit (INDEPENDENT_AMBULATORY_CARE_PROVIDER_SITE_OTHER): Payer: PPO

## 2020-09-16 DIAGNOSIS — I495 Sick sinus syndrome: Secondary | ICD-10-CM | POA: Diagnosis not present

## 2020-09-16 LAB — CUP PACEART REMOTE DEVICE CHECK
Battery Remaining Longevity: 97 mo
Battery Remaining Percentage: 89 %
Battery Voltage: 2.95 V
Brady Statistic AP VP Percent: 1 %
Brady Statistic AP VS Percent: 69 %
Brady Statistic AS VP Percent: 1 %
Brady Statistic AS VS Percent: 30 %
Brady Statistic RA Percent Paced: 69 %
Brady Statistic RV Percent Paced: 1 %
Date Time Interrogation Session: 20220511020013
Implantable Lead Implant Date: 20051022
Implantable Lead Implant Date: 20051022
Implantable Lead Location: 753859
Implantable Lead Location: 753860
Implantable Lead Model: 5076
Implantable Pulse Generator Implant Date: 20150601
Lead Channel Impedance Value: 410 Ohm
Lead Channel Impedance Value: 530 Ohm
Lead Channel Pacing Threshold Amplitude: 1 V
Lead Channel Pacing Threshold Amplitude: 1.25 V
Lead Channel Pacing Threshold Pulse Width: 0.4 ms
Lead Channel Pacing Threshold Pulse Width: 0.4 ms
Lead Channel Sensing Intrinsic Amplitude: 1.5 mV
Lead Channel Sensing Intrinsic Amplitude: 7.8 mV
Lead Channel Setting Pacing Amplitude: 1.5 V
Lead Channel Setting Pacing Amplitude: 2.5 V
Lead Channel Setting Pacing Pulse Width: 0.4 ms
Lead Channel Setting Sensing Sensitivity: 2 mV
Pulse Gen Model: 2240
Pulse Gen Serial Number: 7612285

## 2020-09-23 DIAGNOSIS — M1712 Unilateral primary osteoarthritis, left knee: Secondary | ICD-10-CM | POA: Diagnosis not present

## 2020-09-24 DIAGNOSIS — Z79899 Other long term (current) drug therapy: Secondary | ICD-10-CM | POA: Diagnosis not present

## 2020-10-06 DIAGNOSIS — H60503 Unspecified acute noninfective otitis externa, bilateral: Secondary | ICD-10-CM | POA: Diagnosis not present

## 2020-10-06 DIAGNOSIS — L299 Pruritus, unspecified: Secondary | ICD-10-CM | POA: Diagnosis not present

## 2020-10-08 NOTE — Progress Notes (Signed)
Remote pacemaker transmission.   

## 2020-11-03 DIAGNOSIS — H906 Mixed conductive and sensorineural hearing loss, bilateral: Secondary | ICD-10-CM | POA: Diagnosis not present

## 2020-11-17 DIAGNOSIS — I739 Peripheral vascular disease, unspecified: Secondary | ICD-10-CM | POA: Diagnosis not present

## 2020-11-17 DIAGNOSIS — L603 Nail dystrophy: Secondary | ICD-10-CM | POA: Diagnosis not present

## 2020-11-30 DIAGNOSIS — R600 Localized edema: Secondary | ICD-10-CM | POA: Diagnosis not present

## 2020-11-30 DIAGNOSIS — I10 Essential (primary) hypertension: Secondary | ICD-10-CM | POA: Diagnosis not present

## 2020-12-15 DIAGNOSIS — I10 Essential (primary) hypertension: Secondary | ICD-10-CM | POA: Diagnosis not present

## 2020-12-15 DIAGNOSIS — F3341 Major depressive disorder, recurrent, in partial remission: Secondary | ICD-10-CM | POA: Diagnosis not present

## 2020-12-15 DIAGNOSIS — K219 Gastro-esophageal reflux disease without esophagitis: Secondary | ICD-10-CM | POA: Diagnosis not present

## 2020-12-15 DIAGNOSIS — G20 Parkinson's disease: Secondary | ICD-10-CM | POA: Diagnosis not present

## 2020-12-16 ENCOUNTER — Ambulatory Visit (INDEPENDENT_AMBULATORY_CARE_PROVIDER_SITE_OTHER): Payer: PPO

## 2020-12-16 DIAGNOSIS — Z79899 Other long term (current) drug therapy: Secondary | ICD-10-CM | POA: Diagnosis not present

## 2020-12-16 DIAGNOSIS — E569 Vitamin deficiency, unspecified: Secondary | ICD-10-CM | POA: Diagnosis not present

## 2020-12-16 DIAGNOSIS — I495 Sick sinus syndrome: Secondary | ICD-10-CM

## 2020-12-16 LAB — CUP PACEART REMOTE DEVICE CHECK
Battery Remaining Longevity: 31 mo
Battery Remaining Percentage: 31 %
Battery Voltage: 2.93 V
Brady Statistic AP VP Percent: 1 %
Brady Statistic AP VS Percent: 68 %
Brady Statistic AS VP Percent: 1 %
Brady Statistic AS VS Percent: 31 %
Brady Statistic RA Percent Paced: 68 %
Brady Statistic RV Percent Paced: 1 %
Date Time Interrogation Session: 20220810020020
Implantable Lead Implant Date: 20051022
Implantable Lead Implant Date: 20051022
Implantable Lead Location: 753859
Implantable Lead Location: 753860
Implantable Lead Model: 5076
Implantable Pulse Generator Implant Date: 20150601
Lead Channel Impedance Value: 440 Ohm
Lead Channel Impedance Value: 560 Ohm
Lead Channel Pacing Threshold Amplitude: 1 V
Lead Channel Pacing Threshold Amplitude: 2.75 V
Lead Channel Pacing Threshold Pulse Width: 0.4 ms
Lead Channel Pacing Threshold Pulse Width: 0.4 ms
Lead Channel Sensing Intrinsic Amplitude: 0.9 mV
Lead Channel Sensing Intrinsic Amplitude: 9.1 mV
Lead Channel Setting Pacing Amplitude: 2.5 V
Lead Channel Setting Pacing Amplitude: 3 V
Lead Channel Setting Pacing Pulse Width: 0.4 ms
Lead Channel Setting Sensing Sensitivity: 2 mV
Pulse Gen Model: 2240
Pulse Gen Serial Number: 7612285

## 2020-12-29 DIAGNOSIS — L03213 Periorbital cellulitis: Secondary | ICD-10-CM | POA: Diagnosis not present

## 2020-12-29 DIAGNOSIS — H109 Unspecified conjunctivitis: Secondary | ICD-10-CM | POA: Diagnosis not present

## 2020-12-29 DIAGNOSIS — L299 Pruritus, unspecified: Secondary | ICD-10-CM | POA: Diagnosis not present

## 2020-12-29 DIAGNOSIS — H00012 Hordeolum externum right lower eyelid: Secondary | ICD-10-CM | POA: Diagnosis not present

## 2021-01-06 DIAGNOSIS — Z9181 History of falling: Secondary | ICD-10-CM | POA: Diagnosis not present

## 2021-01-06 DIAGNOSIS — R278 Other lack of coordination: Secondary | ICD-10-CM | POA: Diagnosis not present

## 2021-01-06 DIAGNOSIS — R2681 Unsteadiness on feet: Secondary | ICD-10-CM | POA: Diagnosis not present

## 2021-01-07 NOTE — Progress Notes (Signed)
Remote pacemaker transmission.   

## 2021-01-11 DIAGNOSIS — R2681 Unsteadiness on feet: Secondary | ICD-10-CM | POA: Diagnosis not present

## 2021-01-11 DIAGNOSIS — Z9181 History of falling: Secondary | ICD-10-CM | POA: Diagnosis not present

## 2021-01-11 DIAGNOSIS — R278 Other lack of coordination: Secondary | ICD-10-CM | POA: Diagnosis not present

## 2021-02-18 DIAGNOSIS — H2513 Age-related nuclear cataract, bilateral: Secondary | ICD-10-CM | POA: Diagnosis not present

## 2021-02-18 DIAGNOSIS — H401134 Primary open-angle glaucoma, bilateral, indeterminate stage: Secondary | ICD-10-CM | POA: Diagnosis not present

## 2021-02-18 DIAGNOSIS — H524 Presbyopia: Secondary | ICD-10-CM | POA: Diagnosis not present

## 2021-03-09 DIAGNOSIS — I10 Essential (primary) hypertension: Secondary | ICD-10-CM | POA: Diagnosis not present

## 2021-03-09 DIAGNOSIS — R600 Localized edema: Secondary | ICD-10-CM | POA: Diagnosis not present

## 2021-03-09 DIAGNOSIS — G20 Parkinson's disease: Secondary | ICD-10-CM | POA: Diagnosis not present

## 2021-03-12 DIAGNOSIS — G20 Parkinson's disease: Secondary | ICD-10-CM | POA: Diagnosis not present

## 2021-03-12 DIAGNOSIS — G629 Polyneuropathy, unspecified: Secondary | ICD-10-CM | POA: Diagnosis not present

## 2021-03-12 DIAGNOSIS — R600 Localized edema: Secondary | ICD-10-CM | POA: Diagnosis not present

## 2021-03-12 DIAGNOSIS — M79605 Pain in left leg: Secondary | ICD-10-CM | POA: Diagnosis not present

## 2021-03-17 ENCOUNTER — Ambulatory Visit (INDEPENDENT_AMBULATORY_CARE_PROVIDER_SITE_OTHER): Payer: PPO

## 2021-03-17 DIAGNOSIS — I495 Sick sinus syndrome: Secondary | ICD-10-CM

## 2021-03-17 LAB — CUP PACEART REMOTE DEVICE CHECK
Battery Remaining Longevity: 31 mo
Battery Remaining Percentage: 28 %
Battery Voltage: 2.92 V
Brady Statistic AP VP Percent: 1 %
Brady Statistic AP VS Percent: 68 %
Brady Statistic AS VP Percent: 1 %
Brady Statistic AS VS Percent: 31 %
Brady Statistic RA Percent Paced: 68 %
Brady Statistic RV Percent Paced: 1 %
Date Time Interrogation Session: 20221109020031
Implantable Lead Implant Date: 20051022
Implantable Lead Implant Date: 20051022
Implantable Lead Location: 753859
Implantable Lead Location: 753860
Implantable Lead Model: 5076
Implantable Pulse Generator Implant Date: 20150601
Lead Channel Impedance Value: 430 Ohm
Lead Channel Impedance Value: 590 Ohm
Lead Channel Pacing Threshold Amplitude: 1 V
Lead Channel Pacing Threshold Amplitude: 1.375 V
Lead Channel Pacing Threshold Pulse Width: 0.4 ms
Lead Channel Pacing Threshold Pulse Width: 0.4 ms
Lead Channel Sensing Intrinsic Amplitude: 1.9 mV
Lead Channel Sensing Intrinsic Amplitude: 8.7 mV
Lead Channel Setting Pacing Amplitude: 1.625
Lead Channel Setting Pacing Amplitude: 2.5 V
Lead Channel Setting Pacing Pulse Width: 0.4 ms
Lead Channel Setting Sensing Sensitivity: 2 mV
Pulse Gen Model: 2240
Pulse Gen Serial Number: 7612285

## 2021-03-19 DIAGNOSIS — G629 Polyneuropathy, unspecified: Secondary | ICD-10-CM | POA: Diagnosis not present

## 2021-03-19 DIAGNOSIS — R600 Localized edema: Secondary | ICD-10-CM | POA: Diagnosis not present

## 2021-03-19 DIAGNOSIS — G20 Parkinson's disease: Secondary | ICD-10-CM | POA: Diagnosis not present

## 2021-03-19 DIAGNOSIS — M25562 Pain in left knee: Secondary | ICD-10-CM | POA: Diagnosis not present

## 2021-03-24 DIAGNOSIS — G629 Polyneuropathy, unspecified: Secondary | ICD-10-CM | POA: Diagnosis not present

## 2021-03-24 DIAGNOSIS — R600 Localized edema: Secondary | ICD-10-CM | POA: Diagnosis not present

## 2021-03-24 DIAGNOSIS — M79605 Pain in left leg: Secondary | ICD-10-CM | POA: Diagnosis not present

## 2021-03-24 DIAGNOSIS — G20 Parkinson's disease: Secondary | ICD-10-CM | POA: Diagnosis not present

## 2021-03-25 DIAGNOSIS — R2242 Localized swelling, mass and lump, left lower limb: Secondary | ICD-10-CM | POA: Diagnosis not present

## 2021-03-25 DIAGNOSIS — Z79899 Other long term (current) drug therapy: Secondary | ICD-10-CM | POA: Diagnosis not present

## 2021-03-25 NOTE — Progress Notes (Signed)
Remote pacemaker transmission.   

## 2021-03-29 DIAGNOSIS — M1712 Unilateral primary osteoarthritis, left knee: Secondary | ICD-10-CM | POA: Diagnosis not present

## 2021-03-29 DIAGNOSIS — M19072 Primary osteoarthritis, left ankle and foot: Secondary | ICD-10-CM | POA: Diagnosis not present

## 2021-03-29 DIAGNOSIS — M79662 Pain in left lower leg: Secondary | ICD-10-CM | POA: Diagnosis not present

## 2021-03-30 DIAGNOSIS — R600 Localized edema: Secondary | ICD-10-CM | POA: Diagnosis not present

## 2021-03-30 DIAGNOSIS — M79605 Pain in left leg: Secondary | ICD-10-CM | POA: Diagnosis not present

## 2021-03-30 DIAGNOSIS — G20 Parkinson's disease: Secondary | ICD-10-CM | POA: Diagnosis not present

## 2021-03-30 DIAGNOSIS — G2581 Restless legs syndrome: Secondary | ICD-10-CM | POA: Diagnosis not present

## 2021-04-02 DIAGNOSIS — Z79899 Other long term (current) drug therapy: Secondary | ICD-10-CM | POA: Diagnosis not present

## 2021-04-02 DIAGNOSIS — E569 Vitamin deficiency, unspecified: Secondary | ICD-10-CM | POA: Diagnosis not present

## 2021-04-13 DIAGNOSIS — Z20828 Contact with and (suspected) exposure to other viral communicable diseases: Secondary | ICD-10-CM | POA: Diagnosis not present

## 2021-04-13 DIAGNOSIS — G20 Parkinson's disease: Secondary | ICD-10-CM | POA: Diagnosis not present

## 2021-04-13 DIAGNOSIS — Z789 Other specified health status: Secondary | ICD-10-CM | POA: Diagnosis not present

## 2021-04-14 DIAGNOSIS — R509 Fever, unspecified: Secondary | ICD-10-CM | POA: Diagnosis not present

## 2021-04-14 DIAGNOSIS — J09X3 Influenza due to identified novel influenza A virus with gastrointestinal manifestations: Secondary | ICD-10-CM | POA: Diagnosis not present

## 2021-04-14 DIAGNOSIS — R051 Acute cough: Secondary | ICD-10-CM | POA: Diagnosis not present

## 2021-04-14 DIAGNOSIS — G20 Parkinson's disease: Secondary | ICD-10-CM | POA: Diagnosis not present

## 2021-04-14 DIAGNOSIS — Z789 Other specified health status: Secondary | ICD-10-CM | POA: Diagnosis not present

## 2021-04-15 DIAGNOSIS — R509 Fever, unspecified: Secondary | ICD-10-CM | POA: Diagnosis not present

## 2021-04-15 DIAGNOSIS — R051 Acute cough: Secondary | ICD-10-CM | POA: Diagnosis not present

## 2021-04-15 DIAGNOSIS — G20 Parkinson's disease: Secondary | ICD-10-CM | POA: Diagnosis not present

## 2021-04-15 DIAGNOSIS — Z789 Other specified health status: Secondary | ICD-10-CM | POA: Diagnosis not present

## 2021-04-20 DIAGNOSIS — R051 Acute cough: Secondary | ICD-10-CM | POA: Diagnosis not present

## 2021-04-20 DIAGNOSIS — G20 Parkinson's disease: Secondary | ICD-10-CM | POA: Diagnosis not present

## 2021-04-20 DIAGNOSIS — M545 Low back pain, unspecified: Secondary | ICD-10-CM | POA: Diagnosis not present

## 2021-04-20 DIAGNOSIS — Z20828 Contact with and (suspected) exposure to other viral communicable diseases: Secondary | ICD-10-CM | POA: Diagnosis not present

## 2021-06-03 DIAGNOSIS — M19041 Primary osteoarthritis, right hand: Secondary | ICD-10-CM | POA: Diagnosis not present

## 2021-06-04 DIAGNOSIS — R531 Weakness: Secondary | ICD-10-CM | POA: Diagnosis not present

## 2021-06-04 DIAGNOSIS — N182 Chronic kidney disease, stage 2 (mild): Secondary | ICD-10-CM | POA: Diagnosis not present

## 2021-06-04 DIAGNOSIS — M25531 Pain in right wrist: Secondary | ICD-10-CM | POA: Diagnosis not present

## 2021-06-04 DIAGNOSIS — D696 Thrombocytopenia, unspecified: Secondary | ICD-10-CM | POA: Diagnosis not present

## 2021-06-04 DIAGNOSIS — Z79899 Other long term (current) drug therapy: Secondary | ICD-10-CM | POA: Diagnosis not present

## 2021-06-04 DIAGNOSIS — R059 Cough, unspecified: Secondary | ICD-10-CM | POA: Diagnosis not present

## 2021-06-16 ENCOUNTER — Ambulatory Visit (INDEPENDENT_AMBULATORY_CARE_PROVIDER_SITE_OTHER): Payer: PPO

## 2021-06-16 DIAGNOSIS — I495 Sick sinus syndrome: Secondary | ICD-10-CM

## 2021-06-16 LAB — CUP PACEART REMOTE DEVICE CHECK
Battery Remaining Longevity: 28 mo
Battery Remaining Percentage: 25 %
Battery Voltage: 2.92 V
Brady Statistic AP VP Percent: 1.1 %
Brady Statistic AP VS Percent: 67 %
Brady Statistic AS VP Percent: 1 %
Brady Statistic AS VS Percent: 32 %
Brady Statistic RA Percent Paced: 67 %
Brady Statistic RV Percent Paced: 1.1 %
Date Time Interrogation Session: 20230208020014
Implantable Lead Implant Date: 20051022
Implantable Lead Implant Date: 20051022
Implantable Lead Location: 753859
Implantable Lead Location: 753860
Implantable Lead Model: 5076
Implantable Pulse Generator Implant Date: 20150601
Lead Channel Impedance Value: 400 Ohm
Lead Channel Impedance Value: 590 Ohm
Lead Channel Pacing Threshold Amplitude: 1 V
Lead Channel Pacing Threshold Amplitude: 1.5 V
Lead Channel Pacing Threshold Pulse Width: 0.4 ms
Lead Channel Pacing Threshold Pulse Width: 0.4 ms
Lead Channel Sensing Intrinsic Amplitude: 1.1 mV
Lead Channel Sensing Intrinsic Amplitude: 8.8 mV
Lead Channel Setting Pacing Amplitude: 1.75 V
Lead Channel Setting Pacing Amplitude: 2.5 V
Lead Channel Setting Pacing Pulse Width: 0.4 ms
Lead Channel Setting Sensing Sensitivity: 2 mV
Pulse Gen Model: 2240
Pulse Gen Serial Number: 7612285

## 2021-06-21 NOTE — Progress Notes (Signed)
Remote pacemaker transmission.   

## 2021-07-06 DIAGNOSIS — M25562 Pain in left knee: Secondary | ICD-10-CM | POA: Diagnosis not present

## 2021-07-06 DIAGNOSIS — F331 Major depressive disorder, recurrent, moderate: Secondary | ICD-10-CM | POA: Diagnosis not present

## 2021-07-06 DIAGNOSIS — M1712 Unilateral primary osteoarthritis, left knee: Secondary | ICD-10-CM | POA: Diagnosis not present

## 2021-07-07 DIAGNOSIS — Z7689 Persons encountering health services in other specified circumstances: Secondary | ICD-10-CM | POA: Diagnosis not present

## 2021-07-07 DIAGNOSIS — M1712 Unilateral primary osteoarthritis, left knee: Secondary | ICD-10-CM | POA: Diagnosis not present

## 2021-07-16 DIAGNOSIS — R070 Pain in throat: Secondary | ICD-10-CM | POA: Diagnosis not present

## 2021-07-16 DIAGNOSIS — M1712 Unilateral primary osteoarthritis, left knee: Secondary | ICD-10-CM | POA: Diagnosis not present

## 2021-07-19 DIAGNOSIS — E46 Unspecified protein-calorie malnutrition: Secondary | ICD-10-CM | POA: Diagnosis not present

## 2021-07-19 DIAGNOSIS — E559 Vitamin D deficiency, unspecified: Secondary | ICD-10-CM | POA: Diagnosis not present

## 2021-07-20 DIAGNOSIS — N182 Chronic kidney disease, stage 2 (mild): Secondary | ICD-10-CM | POA: Diagnosis not present

## 2021-07-20 DIAGNOSIS — E860 Dehydration: Secondary | ICD-10-CM | POA: Diagnosis not present

## 2021-07-20 DIAGNOSIS — D696 Thrombocytopenia, unspecified: Secondary | ICD-10-CM | POA: Diagnosis not present

## 2021-07-20 DIAGNOSIS — Z7689 Persons encountering health services in other specified circumstances: Secondary | ICD-10-CM | POA: Diagnosis not present

## 2021-07-22 DIAGNOSIS — R471 Dysarthria and anarthria: Secondary | ICD-10-CM | POA: Diagnosis not present

## 2021-07-22 DIAGNOSIS — G20 Parkinson's disease: Secondary | ICD-10-CM | POA: Diagnosis not present

## 2021-07-22 DIAGNOSIS — R682 Dry mouth, unspecified: Secondary | ICD-10-CM | POA: Diagnosis not present

## 2021-07-22 DIAGNOSIS — K219 Gastro-esophageal reflux disease without esophagitis: Secondary | ICD-10-CM | POA: Diagnosis not present

## 2021-08-02 DIAGNOSIS — R52 Pain, unspecified: Secondary | ICD-10-CM | POA: Diagnosis not present

## 2021-08-02 DIAGNOSIS — M6281 Muscle weakness (generalized): Secondary | ICD-10-CM | POA: Diagnosis not present

## 2021-08-02 DIAGNOSIS — W19XXXA Unspecified fall, initial encounter: Secondary | ICD-10-CM | POA: Diagnosis not present

## 2021-08-02 DIAGNOSIS — R2689 Other abnormalities of gait and mobility: Secondary | ICD-10-CM | POA: Diagnosis not present

## 2021-08-04 DIAGNOSIS — R600 Localized edema: Secondary | ICD-10-CM | POA: Diagnosis not present

## 2021-08-10 DIAGNOSIS — R471 Dysarthria and anarthria: Secondary | ICD-10-CM | POA: Diagnosis not present

## 2021-08-10 DIAGNOSIS — M6281 Muscle weakness (generalized): Secondary | ICD-10-CM | POA: Diagnosis not present

## 2021-08-10 DIAGNOSIS — R2689 Other abnormalities of gait and mobility: Secondary | ICD-10-CM | POA: Diagnosis not present

## 2021-08-10 DIAGNOSIS — R52 Pain, unspecified: Secondary | ICD-10-CM | POA: Diagnosis not present

## 2021-08-10 DIAGNOSIS — W19XXXA Unspecified fall, initial encounter: Secondary | ICD-10-CM | POA: Diagnosis not present

## 2021-08-10 DIAGNOSIS — G8911 Acute pain due to trauma: Secondary | ICD-10-CM | POA: Diagnosis not present

## 2021-08-10 DIAGNOSIS — S32000D Wedge compression fracture of unspecified lumbar vertebra, subsequent encounter for fracture with routine healing: Secondary | ICD-10-CM | POA: Diagnosis not present

## 2021-08-10 DIAGNOSIS — K219 Gastro-esophageal reflux disease without esophagitis: Secondary | ICD-10-CM | POA: Diagnosis not present

## 2021-08-10 DIAGNOSIS — R682 Dry mouth, unspecified: Secondary | ICD-10-CM | POA: Diagnosis not present

## 2021-08-10 DIAGNOSIS — R2681 Unsteadiness on feet: Secondary | ICD-10-CM | POA: Diagnosis not present

## 2021-08-10 DIAGNOSIS — S22000D Wedge compression fracture of unspecified thoracic vertebra, subsequent encounter for fracture with routine healing: Secondary | ICD-10-CM | POA: Diagnosis not present

## 2021-08-10 DIAGNOSIS — G20 Parkinson's disease: Secondary | ICD-10-CM | POA: Diagnosis not present

## 2021-08-11 DIAGNOSIS — Z9181 History of falling: Secondary | ICD-10-CM | POA: Diagnosis not present

## 2021-08-11 DIAGNOSIS — M549 Dorsalgia, unspecified: Secondary | ICD-10-CM | POA: Diagnosis not present

## 2021-08-11 DIAGNOSIS — Z7689 Persons encountering health services in other specified circumstances: Secondary | ICD-10-CM | POA: Diagnosis not present

## 2021-08-12 DIAGNOSIS — H25813 Combined forms of age-related cataract, bilateral: Secondary | ICD-10-CM | POA: Diagnosis not present

## 2021-08-12 DIAGNOSIS — H401134 Primary open-angle glaucoma, bilateral, indeterminate stage: Secondary | ICD-10-CM | POA: Diagnosis not present

## 2021-08-12 DIAGNOSIS — M545 Low back pain, unspecified: Secondary | ICD-10-CM | POA: Diagnosis not present

## 2021-08-12 DIAGNOSIS — H524 Presbyopia: Secondary | ICD-10-CM | POA: Diagnosis not present

## 2021-08-12 DIAGNOSIS — M546 Pain in thoracic spine: Secondary | ICD-10-CM | POA: Diagnosis not present

## 2021-08-24 DIAGNOSIS — M549 Dorsalgia, unspecified: Secondary | ICD-10-CM | POA: Diagnosis not present

## 2021-08-31 DIAGNOSIS — E559 Vitamin D deficiency, unspecified: Secondary | ICD-10-CM | POA: Diagnosis not present

## 2021-08-31 DIAGNOSIS — G20 Parkinson's disease: Secondary | ICD-10-CM | POA: Diagnosis not present

## 2021-08-31 DIAGNOSIS — I10 Essential (primary) hypertension: Secondary | ICD-10-CM | POA: Diagnosis not present

## 2021-08-31 DIAGNOSIS — H04123 Dry eye syndrome of bilateral lacrimal glands: Secondary | ICD-10-CM | POA: Diagnosis not present

## 2021-09-01 DIAGNOSIS — R2243 Localized swelling, mass and lump, lower limb, bilateral: Secondary | ICD-10-CM | POA: Diagnosis not present

## 2021-09-06 DIAGNOSIS — S22000D Wedge compression fracture of unspecified thoracic vertebra, subsequent encounter for fracture with routine healing: Secondary | ICD-10-CM | POA: Diagnosis not present

## 2021-09-06 DIAGNOSIS — S32000D Wedge compression fracture of unspecified lumbar vertebra, subsequent encounter for fracture with routine healing: Secondary | ICD-10-CM | POA: Diagnosis not present

## 2021-09-06 DIAGNOSIS — R2681 Unsteadiness on feet: Secondary | ICD-10-CM | POA: Diagnosis not present

## 2021-09-07 DIAGNOSIS — M5451 Vertebrogenic low back pain: Secondary | ICD-10-CM | POA: Diagnosis not present

## 2021-09-09 DIAGNOSIS — M549 Dorsalgia, unspecified: Secondary | ICD-10-CM | POA: Diagnosis not present

## 2021-09-14 DIAGNOSIS — R590 Localized enlarged lymph nodes: Secondary | ICD-10-CM | POA: Diagnosis not present

## 2021-09-14 DIAGNOSIS — R52 Pain, unspecified: Secondary | ICD-10-CM | POA: Diagnosis not present

## 2021-09-15 ENCOUNTER — Ambulatory Visit (INDEPENDENT_AMBULATORY_CARE_PROVIDER_SITE_OTHER): Payer: PPO

## 2021-09-15 DIAGNOSIS — Z7689 Persons encountering health services in other specified circumstances: Secondary | ICD-10-CM | POA: Diagnosis not present

## 2021-09-15 DIAGNOSIS — I495 Sick sinus syndrome: Secondary | ICD-10-CM

## 2021-09-15 DIAGNOSIS — F458 Other somatoform disorders: Secondary | ICD-10-CM | POA: Diagnosis not present

## 2021-09-15 LAB — CUP PACEART REMOTE DEVICE CHECK
Battery Remaining Longevity: 20 mo
Battery Remaining Percentage: 22 %
Battery Voltage: 2.9 V
Brady Statistic AP VP Percent: 1.4 %
Brady Statistic AP VS Percent: 67 %
Brady Statistic AS VP Percent: 1 %
Brady Statistic AS VS Percent: 32 %
Brady Statistic RA Percent Paced: 67 %
Brady Statistic RV Percent Paced: 1.5 %
Date Time Interrogation Session: 20230510020013
Implantable Lead Implant Date: 20051022
Implantable Lead Implant Date: 20051022
Implantable Lead Location: 753859
Implantable Lead Location: 753860
Implantable Lead Model: 5076
Implantable Pulse Generator Implant Date: 20150601
Lead Channel Impedance Value: 410 Ohm
Lead Channel Impedance Value: 610 Ohm
Lead Channel Pacing Threshold Amplitude: 1 V
Lead Channel Pacing Threshold Amplitude: 1.375 V
Lead Channel Pacing Threshold Pulse Width: 0.4 ms
Lead Channel Pacing Threshold Pulse Width: 0.4 ms
Lead Channel Sensing Intrinsic Amplitude: 0.6 mV
Lead Channel Sensing Intrinsic Amplitude: 8.8 mV
Lead Channel Setting Pacing Amplitude: 2.5 V
Lead Channel Setting Pacing Amplitude: 5 V
Lead Channel Setting Pacing Pulse Width: 0.4 ms
Lead Channel Setting Sensing Sensitivity: 2 mV
Pulse Gen Model: 2240
Pulse Gen Serial Number: 7612285

## 2021-09-20 ENCOUNTER — Telehealth: Payer: Self-pay

## 2021-09-20 DIAGNOSIS — R112 Nausea with vomiting, unspecified: Secondary | ICD-10-CM | POA: Diagnosis not present

## 2021-09-20 DIAGNOSIS — Z7689 Persons encountering health services in other specified circumstances: Secondary | ICD-10-CM | POA: Diagnosis not present

## 2021-09-20 NOTE — Telephone Encounter (Signed)
Patient returning call.

## 2021-09-20 NOTE — Telephone Encounter (Signed)
-----   Message from Oscar La, RN sent at 09/15/2021 12:44 PM EDT ----- ?Call and request manual transmission for Vcap at 5.0V 09/15/21 ? ?

## 2021-09-20 NOTE — Telephone Encounter (Signed)
Unsuccessful telephone encounter to patient/son to request manual transmission to assess for continued high V output. [employer] compliant vm message left requesting call back to [phone]. ?

## 2021-09-20 NOTE — Telephone Encounter (Signed)
Spoke with patient's son who states patient resides at [facility]. Attempted to contact [facility] multiple times without success. Will attempt to contact at later time to request manual transmission to assess for V output at 5.0.  ?

## 2021-09-21 NOTE — Telephone Encounter (Signed)
LVM for patient transportation office to call device clinic back, patient needs apt with Dr. Elberta Fortis or EP/APP to check device in office and set RV threshold.  ?

## 2021-09-21 NOTE — Telephone Encounter (Signed)
Spoke with transportation patient scheduled for 10/01/21 with Otilio Saber at 10:20AM  ?

## 2021-09-23 NOTE — Progress Notes (Signed)
Remote pacemaker transmission.   

## 2021-09-24 DIAGNOSIS — Z79899 Other long term (current) drug therapy: Secondary | ICD-10-CM | POA: Diagnosis not present

## 2021-09-24 DIAGNOSIS — R1110 Vomiting, unspecified: Secondary | ICD-10-CM | POA: Diagnosis not present

## 2021-09-24 DIAGNOSIS — R112 Nausea with vomiting, unspecified: Secondary | ICD-10-CM | POA: Diagnosis not present

## 2021-09-24 DIAGNOSIS — E569 Vitamin deficiency, unspecified: Secondary | ICD-10-CM | POA: Diagnosis not present

## 2021-09-24 DIAGNOSIS — R531 Weakness: Secondary | ICD-10-CM | POA: Diagnosis not present

## 2021-09-28 DIAGNOSIS — Z7689 Persons encountering health services in other specified circumstances: Secondary | ICD-10-CM | POA: Diagnosis not present

## 2021-09-28 DIAGNOSIS — N182 Chronic kidney disease, stage 2 (mild): Secondary | ICD-10-CM | POA: Diagnosis not present

## 2021-09-30 ENCOUNTER — Encounter: Payer: Self-pay | Admitting: Neurology

## 2021-09-30 ENCOUNTER — Ambulatory Visit: Payer: PPO | Admitting: Neurology

## 2021-09-30 VITALS — BP 135/84 | HR 72 | Ht 60.0 in | Wt 96.8 lb

## 2021-09-30 DIAGNOSIS — Z9181 History of falling: Secondary | ICD-10-CM

## 2021-09-30 DIAGNOSIS — R1310 Dysphagia, unspecified: Secondary | ICD-10-CM

## 2021-09-30 DIAGNOSIS — G20 Parkinson's disease: Secondary | ICD-10-CM

## 2021-09-30 NOTE — Progress Notes (Signed)
Subjective:    Patient ID: Teresa Bates is a 88 y.o. female.  HPI    Interim history:   Teresa Bates is an 88 year old right-handed woman with an underlying complex medical history of hypertension, arthritis, anxiety, history of A. fib, aortic regurgitation, history of lumbar compression fracture, mitral regurgitation, anemia, facial pain, hypertension, glaucoma and reflux disease, who presents for follow-up consultation of her parkinsonism.  The patient is accompanied by her son (who is fully bilingual) as well as Teresa Bates from interpretation services today and presents after a gap of nearly 3 years.  I met her once before on 02/28/2019 at the request of her primary care, at which time she reported a prior diagnosis of Parkinson's disease.  She was on Mirapex 0.25 mg 3 times daily.  She was advised to continue with the medication.  She canceled a follow-up appointment with our nurse practitioner in the interim.  She has not been back in this clinic since then.  The patient's allergies, current medications, family history, past medical history, past social history, past surgical history and problem list were reviewed and updated as appropriate.   Today, 09/30/2021: She reports feeling that something is stuck in her throat, she complains of mucus and phlegm in her throat.  Sometimes she coughs when she eats.  She has decreased her food intake because it does not taste good to eat and she feels that sometimes the food may come back up, she has been diagnosed with a thyroid lesion per son, she is supposed to be sent to an endocrinologist.  She sometimes drinks water and when she bends over it comes back out of her nose.  She has been drinking with a straw.  She has fallen frequently per son.  She tends to do things that she probably should not be doing such as making her bed.  Patient admits that she feels nervous when she cannot do something and she feels like she has to be physically active doing something  or another such as hanging up her clothes.  She tries to go to the gym and use the bike for about 10 minutes.  She is often nervous and tremulous and her tremor gets worse when she is nervous.  She has had some interim medication changes in the past 2+ years.  She is for some reason on ropinirole 0.5 mg at bedtime, MAR indicates for restless leg syndrome but she also takes pramipexole 0.5 mg 3 times daily.  She has had lower extremity swelling.  She feels like her left leg is worse with regards to the swelling and difficulty moving it at times.  She was started on Sinemet per son in July 2021 and it was increased to 1 pill 3 times daily in April 2022.  She is on Lyrica 100 mg daily for pain.  She is currently in a wheelchair.  She does walk with a walker per son.  She has been at [facility] skilled nursing facility.  I reviewed records, primarily MAR, no office visit notes are included.  She discontinued speech therapy in early April 2023.  She is supposed to wear a TED hose, she currently does not wear any.  Previously:   02/28/19: (She) was diagnosed with Parkinson's disease some 4 years ago.  She was in [place] at the time.  She had left foot tremor as one of her for symptoms.  Her history is primarily provided by her son.  He reports that she was started on Mirapex.  They  had recently reduced it because of hallucinations.  She has fallen.  She fell in August, despite using her walker.  She has had some physical therapy.  She resides at [facility].  Her son also reports that she was diagnosed with trigeminal neuralgia.  She has been on Lyrica for this.  She is no longer taking oxycodone and has tramadol as needed.  She is currently on Mirapex 0.25 mg 3 times daily.  She has had balance problems, changes in her posture, and has developed lower extremity swelling.  She fell twice in July.  She has had some elevated blood pressure values according to her son.   Her Past Medical History Is Significant  For: Past Medical History:  Diagnosis Date   A-fib [facility])    PAROXYSMAL   Abnormal electrocardiogram    Anxiety    Aortic regurgitation    Arthritis    Depression    Essential hypertension 02/05/2018   Hypertension    Hypomagnesemia 02/05/2018   Lumbar compression fracture (HCC) 01/31/2018   Mitral regurgitation    Nonrheumatic mitral (valve) insufficiency    Parkinson's disease ([place])     Her Past Surgical History Is Significant For: Past Surgical History:  Procedure Laterality Date   PACEMAKER INSERTION  10/2013    Her Family History Is Significant For: Family History  Problem Relation Age of Onset   Heart failure Mother    Heart attack Father     Her Social History Is Significant For: Social History   Socioeconomic History   Marital status: Widowed    Spouse name: Not on file   Number of children: Not on file   Years of education: Not on file   Highest education level: Not on file  Occupational History   Not on file  Tobacco Use   Smoking status: Never   Smokeless tobacco: Never  Vaping Use   Vaping Use: Never used  Substance and Sexual Activity   Alcohol use: Never   Drug use: Never   Sexual activity: Not on file  Other Topics Concern   Not on file  Social History Narrative   Not on file   Social Determinants of Health   Financial Resource Strain: Not on file  Food Insecurity: Not on file  Transportation Needs: Not on file  Physical Activity: Not on file  Stress: Not on file  Social Connections: Not on file    Her Allergies Are:  Allergies  Allergen Reactions   Carbamazepine Rash  :   Her Current Medications Are:  Outpatient Encounter Medications as of 09/30/2021  Medication Sig   brimonidine (ALPHAGAN) 0.2 % ophthalmic solution Place 1 drop into both eyes in the morning and at bedtime.   calcium-vitamin D (OSCAL WITH D) 500-200 MG-UNIT tablet Take 1 tablet by mouth daily with breakfast.   carbidopa-levodopa (SINEMET IR) 25-100 MG tablet  Take 1 tablet by mouth 3 (three) times daily.   citalopram (CELEXA) 10 MG tablet Take 10 mg by mouth daily.   diclofenac Sodium (VOLTAREN) 1 % GEL Apply topically. TID topically BLE for arthritic pain   Ensure (ENSURE) Take 237 mLs by mouth in the morning and at bedtime.   furosemide (LASIX) 20 MG tablet Take 20 mg by mouth daily.   HYDROcodone-acetaminophen (NORCO/VICODIN) 5-325 MG tablet Take 1 tablet by mouth every 4 (four) hours as needed for moderate pain.   lidocaine (XYLOCAINE) 1 % (with preservative) injection 0.5 mLs by Other route. Intranasally one time a day for pain.  lisinopril (ZESTRIL) 5 MG tablet Take 1 tablet (5 mg total) by mouth daily.   LUMIGAN 0.01 % SOLN Place 1 drop into both eyes at bedtime.   metoprolol succinate (TOPROL-XL) 25 MG 24 hr tablet Take 2 tablets (50 mg total) by mouth daily.   omeprazole (PRILOSEC) 20 MG capsule Take 20 mg by mouth daily.   ondansetron (ZOFRAN) 4 MG tablet Take 1 tablet (4 mg total) by mouth every 6 (six) hours as needed for nausea.   potassium chloride (KLOR-CON M) 10 MEQ tablet Take 10 mEq by mouth daily.   pramipexole (MIRAPEX) 0.5 MG tablet Take 0.5 mg by mouth 3 (three) times daily.   pregabalin (LYRICA) 100 MG capsule Take 100 mg by mouth 3 (three) times daily.   promethazine (PHENERGAN) 25 MG/ML injection Inject 12.5 mg into the muscle every 6 (six) hours as needed for nausea or vomiting.   rOPINIRole (REQUIP) 0.5 MG tablet Take 0.5 mg by mouth at bedtime.   senna-docusate (SENOKOT-S) 8.6-50 MG tablet Take 1 tablet by mouth 2 (two) times daily.   UNABLE TO FIND Med Name: med pass 183mk daukt   Vitamin D, Ergocalciferol, (DRISDOL) 1.25 MG (50000 UNIT) CAPS capsule Take 50,000 Units by mouth. 1 tab daily once monthly on 15th.   [DISCONTINUED] acetaminophen (TYLENOL) 500 MG tablet Take 500 mg by mouth every 6 (six) hours as needed.   [DISCONTINUED] Cholecalciferol (VITAMIN D3) 50 MCG (2000 UT) TABS Take 50 mcg by mouth daily.    [DISCONTINUED] citalopram (CELEXA) 20 MG tablet Take 20 mg by mouth at bedtime.   [DISCONTINUED] HYDROcodone-acetaminophen (NORCO/VICODIN) 5-325 MG tablet Take 1 tablet by mouth every 6 (six) hours as needed for severe pain.   [DISCONTINUED] pramipexole (MIRAPEX) 0.25 MG tablet Take 0.25 mg by mouth 3 (three) times daily.   [DISCONTINUED] pregabalin (LYRICA) 75 MG capsule Take 100 mg by mouth every 8 (eight) hours.    [DISCONTINUED] traMADol (ULTRAM) 50 MG tablet Take 1 tablet (50 mg total) by mouth 3 (three) times daily as needed for moderate pain.   No facility-administered encounter medications on file as of 09/30/2021.  :  Review of Systems:  Out of a complete 14 point review of systems, all are reviewed and negative with the exception of these symptoms as listed below:  Review of Systems  Neurological:        Parkinson's Disease.  Difficulty swallowing, will be seeing endocrinology.  Balance off, falls, is living at [facility].  Tremors under control with medication.     Objective:  Neurological Exam  Physical Exam Physical Examination:   Vitals:   09/30/21 0743  BP: 135/84  Pulse: 72    General Examination: The patient is a very pleasant 88 y.o. female in no acute distress, she appears frail, well groomed, situated in her wheelchair, able to provide good answers to questions.    HEENT: Normocephalic, atraumatic, tracking well-preserved, hearing aids in place.   Face is symmetric with mild facial masking, mild to moderate nuchal rigidity noted.  Anterior flexion of neck noted.  No obvious neck swelling.  Shoulder height appears to be equal.  Tongue protrudes centrally in palate elevates symmetrically.  Airway examination reveals moderate mouth dryness, no sialorrhea.  No orofacial dyskinesias. Mild to moderate hypophonia, no obvious dysarthria, Spanish-speaking, able to answer questions appropriately.   Chest: Clear to auscultation without wheezing, rhonchi or crackles noted.    Heart: S1+S2+0, regular and normal without murmurs, rubs or gallops noted.    Abdomen: Soft, non-tender  and non-distended with normal bowel sounds appreciated on auscultation.   Extremities: There is 1+ pitting edema in the distal lower extremities bilaterally, Mostly around the ankles.   Skin: Warm and dry without trophic changes noted.   Musculoskeletal: exam reveals arthritic changes in both hands, mild knee swelling bilaterally.   Neurologically:  Mental status: The patient is awake, alert and oriented in all 4 spheres. Her immediate and remote memory, attention, language skills and fund of knowledge are mildly impaired.  Her memory is somewhat difficult to assess, her history is primarily provided by her son, she is somewhat slow in responding, shorter sentences. Cranial nerves II - XII are as described above under HEENT exam.   (On 02/28/2019: On Archimedes spiral drawing she has coarse difficulty with the left hand, no significant trembling or difficulty with the right hand which is her dominant hand, handwriting is small and difficult to read, nearly illegible.)   Motor exam: Thin bulk, global strength of approximately 4 out of 5, no obvious resting tremor currently, increased left more than right upper extremity tone, but no obvious cogwheeling.  Fine motor skills are impaired.  She did not bring her walker today.  I did not feel it was safe to stand and walker as she has had recurrent falls despite using a walker. Sensory exam: intact to light touch.     Assessment and Plan:  In summary, Teresa Bates is a very pleasant 88 year old female with an underlying complex medical history of hypertension, arthritis, anxiety, history of A. fib, aortic regurgitation, history of lumbar compression fracture, mitral regurgitation, anemia, facial pain, hypertension, glaucoma and reflux disease, who presents for follow-up consultation of her parkinsonism.  She has been residing at [facility] facility since 2019.  She was diagnosed originally when she was still residing in [place].  She is currently on pramipexole 0.5 mg 3 times daily and I recommended the Requip 0.5 mg nightly to be discontinued.  If anything, pramipexole can be increased to 4 times daily but I do not favor this quite yet.  She has lower extremity swelling which could be exacerbated by dopamine agonist and son also later reports that she has had hallucinations.  She is at high fall risk.  She is advised not to do activities that could be risky for her including making the bed and holding up" above her shoulder height because it can make her fall backwards.  She can continue with the levodopa 3 times a day, she is supposed to see endocrinology for a thyroid problem.  I suggested she pursue a swallow study through the facility.  She may have to modify her dietary consistency.  She is encouraged not to use a straw for liquids.  She is advised to stand completely upright when eating and drinking and not bend over.  Unfortunately, her posture has become worse and difficulty swallowing can be seen with Parkinson's patients.  I do not believe she is completely safe to use the walker by herself.  I recommended a routine follow-up with our nurse practitioner in 6 months, sooner if needed.  I answered all their questions today and the patient and her son were in agreement.  I spent 30 minutes in total face-to-face time and in reviewing records during pre-charting, more than 50% of which was spent in counseling and coordination of care, reviewing test results, reviewing medications and treatment regimen and/or in discussing or reviewing the diagnosis of PD, the prognosis and treatment options. Pertinent laboratory  and imaging test results that were available during this visit with the patient were reviewed by me and considered in my medical decision making (see chart for details).

## 2021-10-01 ENCOUNTER — Encounter: Payer: PPO | Admitting: Student

## 2021-10-06 NOTE — Progress Notes (Deleted)
Electrophysiology Office Note Date: 10/06/2021  ID:  Teresa Bates, DOB 06-22-1929, MRN [id number]  PCP: Rosetta Posner, MD Primary Cardiologist: None Electrophysiologist: Will Jorja Loa, MD   CC: Pacemaker follow-up  Teresa Bates is a 88 y.o. female seen today for Will Jorja Loa, MD for routine electrophysiology followup.  Since last being seen in our clinic the patient reports doing ***.  she denies chest pain, palpitations, dyspnea, PND, orthopnea, nausea, vomiting, dizziness, syncope, edema, weight gain, or early satiety.  Device History: [employer] PPM implanted 2005, gen change 2015 for tachy-brady  Past Medical History:  Diagnosis Date   A-fib (HCC)    PAROXYSMAL   Abnormal electrocardiogram    Anxiety    Aortic regurgitation    Arthritis    Depression    Essential hypertension 02/05/2018   Hypertension    Hypomagnesemia 02/05/2018   Lumbar compression fracture (HCC) 01/31/2018   Mitral regurgitation    Nonrheumatic mitral (valve) insufficiency    Parkinson's disease (HCC)    Past Surgical History:  Procedure Laterality Date   PACEMAKER INSERTION  10/2013    Current Outpatient Medications  Medication Sig Dispense Refill   brimonidine (ALPHAGAN) 0.2 % ophthalmic solution Place 1 drop into both eyes in the morning and at bedtime.     calcium-vitamin D (OSCAL WITH D) 500-200 MG-UNIT tablet Take 1 tablet by mouth daily with breakfast. 30 tablet 0   carbidopa-levodopa (SINEMET IR) 25-100 MG tablet Take 1 tablet by mouth 3 (three) times daily.     citalopram (CELEXA) 10 MG tablet Take 10 mg by mouth daily.     diclofenac Sodium (VOLTAREN) 1 % GEL Apply topically. TID topically BLE for arthritic pain     Ensure (ENSURE) Take 237 mLs by mouth in the morning and at bedtime.     furosemide (LASIX) 20 MG tablet Take 20 mg by mouth daily.     HYDROcodone-acetaminophen (NORCO/VICODIN) 5-325 MG tablet Take 1 tablet by mouth every 4 (four) hours as  needed for moderate pain.     lidocaine (XYLOCAINE) 1 % (with preservative) injection 0.5 mLs by Other route. Intranasally one time a day for pain.     lisinopril (ZESTRIL) 5 MG tablet Take 1 tablet (5 mg total) by mouth daily. 90 tablet 3   LUMIGAN 0.01 % SOLN Place 1 drop into both eyes at bedtime.     metoprolol succinate (TOPROL-XL) 25 MG 24 hr tablet Take 2 tablets (50 mg total) by mouth daily. 60 tablet 3   omeprazole (PRILOSEC) 20 MG capsule Take 20 mg by mouth daily.     ondansetron (ZOFRAN) 4 MG tablet Take 1 tablet (4 mg total) by mouth every 6 (six) hours as needed for nausea. 20 tablet 0   potassium chloride (KLOR-CON M) 10 MEQ tablet Take 10 mEq by mouth daily.     pramipexole (MIRAPEX) 0.5 MG tablet Take 0.5 mg by mouth 3 (three) times daily.     pregabalin (LYRICA) 100 MG capsule Take 100 mg by mouth 3 (three) times daily.     promethazine (PHENERGAN) 25 MG/ML injection Inject 12.5 mg into the muscle every 6 (six) hours as needed for nausea or vomiting.     rOPINIRole (REQUIP) 0.5 MG tablet Take 0.5 mg by mouth at bedtime.     senna-docusate (SENOKOT-S) 8.6-50 MG tablet Take 1 tablet by mouth 2 (two) times daily.     UNABLE TO FIND Med Name: med pass daukt  Vitamin D, Ergocalciferol, (DRISDOL) 1.25 MG (50000 UNIT) CAPS capsule Take 50,000 Units by mouth. 1 tab daily once monthly on 15th.     No current facility-administered medications for this visit.    Allergies:   Carbamazepine   Social History: Social History   Socioeconomic History   Marital status: Widowed    Spouse name: Not on file   Number of children: Not on file   Years of education: Not on file   Highest education level: Not on file  Occupational History   Not on file  Tobacco Use   Smoking status: Never   Smokeless tobacco: Never  Vaping Use   Vaping Use: Never used  Substance and Sexual Activity   Alcohol use: Never   Drug use: Never   Sexual activity: Not on file  Other Topics Concern    Not on file  Social History Narrative   Not on file   Social Determinants of Health   Financial Resource Strain: Not on file  Food Insecurity: Not on file  Transportation Needs: Not on file  Physical Activity: Not on file  Stress: Not on file  Social Connections: Not on file  Intimate Partner Violence: Not on file    Family History: Family History  Problem Relation Age of Onset   Heart failure Mother    Heart attack Father      Review of Systems: All other systems reviewed and are otherwise negative except as noted above.  Physical Exam: There were no vitals filed for this visit.   GEN- The patient is well appearing, alert and oriented x 3 today.   HEENT: normocephalic, atraumatic; sclera clear, conjunctiva pink; hearing intact; oropharynx clear; neck supple  Lungs- Clear to ausculation bilaterally, normal work of breathing.  No wheezes, rales, rhonchi Heart- Regular rate and rhythm, no murmurs, rubs or gallops  GI- soft, non-tender, non-distended, bowel sounds present  Extremities- no clubbing or cyanosis. No edema MS- no significant deformity or atrophy Skin- warm and dry, no rash or lesion; PPM pocket well healed Psych- euthymic mood, full affect Neuro- strength and sensation are intact  PPM Interrogation- reviewed in detail today,  See PACEART report  EKG:  EKG is ordered today. Personal review of ekg ordered today shows ***   Recent Labs: No results found for requested labs within last 8760 hours.   Wt Readings from Last 3 Encounters:  09/30/21 96 lb 12.8 oz (43.9 kg)  09/02/19 110 lb 6.4 oz (50.1 kg)  03/13/19 107 lb (48.5 kg)     Other studies Reviewed: Additional studies/ records that were reviewed today include: Previous EP office notes, Previous remote checks, Most recent labwork.   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 PAF Continue toprol Labs today Off  OAC with frequent falls     Current medicines are reviewed at length with the patient today.    Labs/ tests ordered today include: *** No orders of the defined types were placed in this encounter.    Disposition:   Follow up with {Blank single:19197::"Dr. Allred","Dr. Amada Jupiter. Klein","Dr. Camnitz","Dr. Lambert","EP APP"} in {Blank single:19197::"2 weeks","4 weeks","3 months","6 months","12 months","as usual post gen change"}    Signed, Luane School  10/06/2021 10:37 AM  [facility] [address] [place] [address] [phone] (office) [phone] (fax)

## 2021-10-12 ENCOUNTER — Encounter: Payer: PPO | Admitting: Student

## 2021-10-12 DIAGNOSIS — I495 Sick sinus syndrome: Secondary | ICD-10-CM

## 2021-10-12 DIAGNOSIS — I10 Essential (primary) hypertension: Secondary | ICD-10-CM

## 2021-10-12 DIAGNOSIS — I480 Paroxysmal atrial fibrillation: Secondary | ICD-10-CM

## 2021-10-15 DIAGNOSIS — M6281 Muscle weakness (generalized): Secondary | ICD-10-CM | POA: Diagnosis not present

## 2021-10-15 DIAGNOSIS — Z9181 History of falling: Secondary | ICD-10-CM | POA: Diagnosis not present

## 2021-10-15 DIAGNOSIS — R52 Pain, unspecified: Secondary | ICD-10-CM | POA: Diagnosis not present

## 2021-10-15 DIAGNOSIS — R2689 Other abnormalities of gait and mobility: Secondary | ICD-10-CM | POA: Diagnosis not present

## 2021-11-12 DIAGNOSIS — K5900 Constipation, unspecified: Secondary | ICD-10-CM | POA: Diagnosis not present

## 2021-11-12 DIAGNOSIS — I10 Essential (primary) hypertension: Secondary | ICD-10-CM | POA: Diagnosis not present

## 2021-11-12 DIAGNOSIS — G20 Parkinson's disease: Secondary | ICD-10-CM | POA: Diagnosis not present

## 2021-11-12 DIAGNOSIS — R52 Pain, unspecified: Secondary | ICD-10-CM | POA: Diagnosis not present

## 2021-11-15 DIAGNOSIS — E559 Vitamin D deficiency, unspecified: Secondary | ICD-10-CM | POA: Diagnosis not present

## 2021-11-15 DIAGNOSIS — Z79899 Other long term (current) drug therapy: Secondary | ICD-10-CM | POA: Diagnosis not present

## 2021-11-15 DIAGNOSIS — E119 Type 2 diabetes mellitus without complications: Secondary | ICD-10-CM | POA: Diagnosis not present

## 2021-11-16 DIAGNOSIS — N182 Chronic kidney disease, stage 2 (mild): Secondary | ICD-10-CM | POA: Diagnosis not present

## 2021-11-16 DIAGNOSIS — Z7689 Persons encountering health services in other specified circumstances: Secondary | ICD-10-CM | POA: Diagnosis not present

## 2021-11-23 DIAGNOSIS — Z9181 History of falling: Secondary | ICD-10-CM | POA: Diagnosis not present

## 2021-11-23 DIAGNOSIS — R2689 Other abnormalities of gait and mobility: Secondary | ICD-10-CM | POA: Diagnosis not present

## 2021-11-23 DIAGNOSIS — S0083XA Contusion of other part of head, initial encounter: Secondary | ICD-10-CM | POA: Diagnosis not present

## 2021-11-23 DIAGNOSIS — M6281 Muscle weakness (generalized): Secondary | ICD-10-CM | POA: Diagnosis not present

## 2021-12-09 DIAGNOSIS — Z9181 History of falling: Secondary | ICD-10-CM | POA: Diagnosis not present

## 2021-12-09 DIAGNOSIS — R2689 Other abnormalities of gait and mobility: Secondary | ICD-10-CM | POA: Diagnosis not present

## 2021-12-09 DIAGNOSIS — M25521 Pain in right elbow: Secondary | ICD-10-CM | POA: Diagnosis not present

## 2021-12-09 DIAGNOSIS — G8911 Acute pain due to trauma: Secondary | ICD-10-CM | POA: Diagnosis not present

## 2021-12-09 DIAGNOSIS — M6281 Muscle weakness (generalized): Secondary | ICD-10-CM | POA: Diagnosis not present

## 2021-12-09 DIAGNOSIS — M25511 Pain in right shoulder: Secondary | ICD-10-CM | POA: Diagnosis not present

## 2021-12-09 DIAGNOSIS — M79601 Pain in right arm: Secondary | ICD-10-CM | POA: Diagnosis not present

## 2021-12-10 DIAGNOSIS — M6281 Muscle weakness (generalized): Secondary | ICD-10-CM | POA: Diagnosis not present

## 2021-12-10 DIAGNOSIS — Z9181 History of falling: Secondary | ICD-10-CM | POA: Diagnosis not present

## 2021-12-10 DIAGNOSIS — R2689 Other abnormalities of gait and mobility: Secondary | ICD-10-CM | POA: Diagnosis not present

## 2021-12-10 DIAGNOSIS — M79601 Pain in right arm: Secondary | ICD-10-CM | POA: Diagnosis not present

## 2021-12-15 ENCOUNTER — Ambulatory Visit (INDEPENDENT_AMBULATORY_CARE_PROVIDER_SITE_OTHER): Payer: PPO

## 2021-12-15 DIAGNOSIS — I495 Sick sinus syndrome: Secondary | ICD-10-CM

## 2021-12-15 LAB — CUP PACEART REMOTE DEVICE CHECK
Battery Remaining Longevity: 18 mo
Battery Remaining Percentage: 20 %
Battery Voltage: 2.89 V
Brady Statistic AP VP Percent: 1.6 %
Brady Statistic AP VS Percent: 66 %
Brady Statistic AS VP Percent: 1 %
Brady Statistic AS VS Percent: 32 %
Brady Statistic RA Percent Paced: 67 %
Brady Statistic RV Percent Paced: 1.7 %
Date Time Interrogation Session: 20230809020012
Implantable Lead Implant Date: 20051022
Implantable Lead Implant Date: 20051022
Implantable Lead Location: 753859
Implantable Lead Location: 753860
Implantable Lead Model: 5076
Implantable Pulse Generator Implant Date: 20150601
Lead Channel Impedance Value: 400 Ohm
Lead Channel Impedance Value: 560 Ohm
Lead Channel Pacing Threshold Amplitude: 1 V
Lead Channel Pacing Threshold Amplitude: 1.375 V
Lead Channel Pacing Threshold Pulse Width: 0.4 ms
Lead Channel Pacing Threshold Pulse Width: 0.4 ms
Lead Channel Sensing Intrinsic Amplitude: 1.8 mV
Lead Channel Sensing Intrinsic Amplitude: 7.8 mV
Lead Channel Setting Pacing Amplitude: 2.5 V
Lead Channel Setting Pacing Amplitude: 5 V
Lead Channel Setting Pacing Pulse Width: 0.4 ms
Lead Channel Setting Sensing Sensitivity: 2 mV
Pulse Gen Model: 2240
Pulse Gen Serial Number: 7612285

## 2021-12-22 DIAGNOSIS — B351 Tinea unguium: Secondary | ICD-10-CM | POA: Diagnosis not present

## 2021-12-22 DIAGNOSIS — I739 Peripheral vascular disease, unspecified: Secondary | ICD-10-CM | POA: Diagnosis not present

## 2021-12-23 DIAGNOSIS — H93299 Other abnormal auditory perceptions, unspecified ear: Secondary | ICD-10-CM | POA: Diagnosis not present

## 2022-01-13 DIAGNOSIS — M6281 Muscle weakness (generalized): Secondary | ICD-10-CM | POA: Diagnosis not present

## 2022-01-13 DIAGNOSIS — W19XXXA Unspecified fall, initial encounter: Secondary | ICD-10-CM | POA: Diagnosis not present

## 2022-01-13 DIAGNOSIS — R2689 Other abnormalities of gait and mobility: Secondary | ICD-10-CM | POA: Diagnosis not present

## 2022-01-13 DIAGNOSIS — L729 Follicular cyst of the skin and subcutaneous tissue, unspecified: Secondary | ICD-10-CM | POA: Diagnosis not present

## 2022-01-17 ENCOUNTER — Telehealth: Payer: Self-pay | Admitting: Cardiology

## 2022-01-17 ENCOUNTER — Encounter: Payer: Self-pay | Admitting: Cardiology

## 2022-01-17 NOTE — Telephone Encounter (Signed)
New message  Pt son returned call about getting pt scheduled for in office visit with WC or EP APP. Pt had last visit on 10.20.2020. Son wants to know if she can continue to do remote transmissions until something comes up because it is becoming more and more difficult for pt to attend appts. He wants to know if there was something on the last transmission that was received.

## 2022-01-17 NOTE — Progress Notes (Signed)
Remote pacemaker transmission.   

## 2022-01-19 NOTE — Telephone Encounter (Signed)
Spoke to son. Aware ok, per Dr. Elberta Fortis, to monitor pt remotely going forward unless something comes up. Son mentioned that he noticed her PPM battery has about a year left on it and wondered what would be the plan then.  Informed that we would cross that bridge when we get to it but did explain we would have some time once pt triggers ERI to decide/form a plan.  Also asked about pt and bld thinner.  He states that Dr. Bjorn Pippin stopped it about a year/more ago d/t frequent falls.

## 2022-01-31 DIAGNOSIS — E049 Nontoxic goiter, unspecified: Secondary | ICD-10-CM | POA: Diagnosis not present

## 2022-01-31 DIAGNOSIS — R634 Abnormal weight loss: Secondary | ICD-10-CM | POA: Diagnosis not present

## 2022-01-31 DIAGNOSIS — R1310 Dysphagia, unspecified: Secondary | ICD-10-CM | POA: Diagnosis not present

## 2022-02-04 ENCOUNTER — Ambulatory Visit (INDEPENDENT_AMBULATORY_CARE_PROVIDER_SITE_OTHER): Payer: PPO

## 2022-02-04 ENCOUNTER — Telehealth: Payer: Self-pay

## 2022-02-04 DIAGNOSIS — I495 Sick sinus syndrome: Secondary | ICD-10-CM

## 2022-02-04 NOTE — Telephone Encounter (Signed)
Alert received.  Pt RV threshold went into high output 01/28/22 and has remained at high output on recheck.  Family has indicated getting Pt to appointment's has become increasingly difficult.  Will discuss with Dr. Curt Bears and Venida Jarvis.

## 2022-02-07 NOTE — Telephone Encounter (Signed)
Left message to call back  

## 2022-02-10 NOTE — Progress Notes (Signed)
Remote pacemaker transmission.   

## 2022-02-11 NOTE — Telephone Encounter (Signed)
Pt's son returning nurses call. Please advise 

## 2022-02-14 NOTE — Telephone Encounter (Signed)
Son returned my call. Very difficult to move pt, much less get her to doctor appointment. Aware I will inform MD, and that I think we can most likely continuing monitoring PPM remotely for now.  Aware that when PPM battery reaches needing replacement then we may have to schedule an OV to discuss. Will forward to MD for review/advisement/approval of remote monitoring for now.

## 2022-02-14 NOTE — Telephone Encounter (Signed)
Left message to call back  

## 2022-02-15 DIAGNOSIS — M25561 Pain in right knee: Secondary | ICD-10-CM | POA: Diagnosis not present

## 2022-02-15 DIAGNOSIS — M6281 Muscle weakness (generalized): Secondary | ICD-10-CM | POA: Diagnosis not present

## 2022-02-15 DIAGNOSIS — R2689 Other abnormalities of gait and mobility: Secondary | ICD-10-CM | POA: Diagnosis not present

## 2022-02-15 DIAGNOSIS — W19XXXA Unspecified fall, initial encounter: Secondary | ICD-10-CM | POA: Diagnosis not present

## 2022-02-16 DIAGNOSIS — R1310 Dysphagia, unspecified: Secondary | ICD-10-CM | POA: Diagnosis not present

## 2022-02-16 DIAGNOSIS — K224 Dyskinesia of esophagus: Secondary | ICD-10-CM | POA: Diagnosis not present

## 2022-02-16 DIAGNOSIS — M25561 Pain in right knee: Secondary | ICD-10-CM | POA: Diagnosis not present

## 2022-02-16 DIAGNOSIS — M1712 Unilateral primary osteoarthritis, left knee: Secondary | ICD-10-CM | POA: Diagnosis not present

## 2022-02-18 DIAGNOSIS — B372 Candidiasis of skin and nail: Secondary | ICD-10-CM | POA: Diagnosis not present

## 2022-02-21 DIAGNOSIS — R52 Pain, unspecified: Secondary | ICD-10-CM | POA: Diagnosis not present

## 2022-02-21 DIAGNOSIS — M6281 Muscle weakness (generalized): Secondary | ICD-10-CM | POA: Diagnosis not present

## 2022-02-21 DIAGNOSIS — R2689 Other abnormalities of gait and mobility: Secondary | ICD-10-CM | POA: Diagnosis not present

## 2022-02-21 DIAGNOSIS — S51812A Laceration without foreign body of left forearm, initial encounter: Secondary | ICD-10-CM | POA: Diagnosis not present

## 2022-02-22 DIAGNOSIS — B351 Tinea unguium: Secondary | ICD-10-CM | POA: Diagnosis not present

## 2022-02-22 DIAGNOSIS — L603 Nail dystrophy: Secondary | ICD-10-CM | POA: Diagnosis not present

## 2022-02-22 DIAGNOSIS — I739 Peripheral vascular disease, unspecified: Secondary | ICD-10-CM | POA: Diagnosis not present

## 2022-02-23 DIAGNOSIS — G20C Parkinsonism, unspecified: Secondary | ICD-10-CM | POA: Diagnosis not present

## 2022-02-23 DIAGNOSIS — M6281 Muscle weakness (generalized): Secondary | ICD-10-CM | POA: Diagnosis not present

## 2022-02-23 DIAGNOSIS — R1314 Dysphagia, pharyngoesophageal phase: Secondary | ICD-10-CM | POA: Diagnosis not present

## 2022-02-23 DIAGNOSIS — R2689 Other abnormalities of gait and mobility: Secondary | ICD-10-CM | POA: Diagnosis not present

## 2022-02-23 DIAGNOSIS — R278 Other lack of coordination: Secondary | ICD-10-CM | POA: Diagnosis not present

## 2022-03-09 DIAGNOSIS — R278 Other lack of coordination: Secondary | ICD-10-CM | POA: Diagnosis not present

## 2022-03-09 DIAGNOSIS — M6281 Muscle weakness (generalized): Secondary | ICD-10-CM | POA: Diagnosis not present

## 2022-03-09 DIAGNOSIS — R1314 Dysphagia, pharyngoesophageal phase: Secondary | ICD-10-CM | POA: Diagnosis not present

## 2022-03-09 DIAGNOSIS — G20C Parkinsonism, unspecified: Secondary | ICD-10-CM | POA: Diagnosis not present

## 2022-03-09 DIAGNOSIS — R2689 Other abnormalities of gait and mobility: Secondary | ICD-10-CM | POA: Diagnosis not present

## 2022-03-10 DIAGNOSIS — M6281 Muscle weakness (generalized): Secondary | ICD-10-CM | POA: Diagnosis not present

## 2022-03-10 DIAGNOSIS — R52 Pain, unspecified: Secondary | ICD-10-CM | POA: Diagnosis not present

## 2022-03-10 DIAGNOSIS — S51012A Laceration without foreign body of left elbow, initial encounter: Secondary | ICD-10-CM | POA: Diagnosis not present

## 2022-03-10 DIAGNOSIS — R2689 Other abnormalities of gait and mobility: Secondary | ICD-10-CM | POA: Diagnosis not present

## 2022-03-11 DIAGNOSIS — E559 Vitamin D deficiency, unspecified: Secondary | ICD-10-CM | POA: Diagnosis not present

## 2022-03-11 DIAGNOSIS — R531 Weakness: Secondary | ICD-10-CM | POA: Diagnosis not present

## 2022-03-11 DIAGNOSIS — E46 Unspecified protein-calorie malnutrition: Secondary | ICD-10-CM | POA: Diagnosis not present

## 2022-03-14 DIAGNOSIS — E860 Dehydration: Secondary | ICD-10-CM | POA: Diagnosis not present

## 2022-03-14 DIAGNOSIS — N1831 Chronic kidney disease, stage 3a: Secondary | ICD-10-CM | POA: Diagnosis not present

## 2022-03-16 ENCOUNTER — Ambulatory Visit (INDEPENDENT_AMBULATORY_CARE_PROVIDER_SITE_OTHER): Payer: PPO

## 2022-03-16 DIAGNOSIS — I495 Sick sinus syndrome: Secondary | ICD-10-CM

## 2022-03-16 LAB — CUP PACEART REMOTE DEVICE CHECK
Battery Remaining Longevity: 15 mo
Battery Remaining Percentage: 17 %
Battery Voltage: 2.87 V
Brady Statistic AP VP Percent: 2 %
Brady Statistic AP VS Percent: 65 %
Brady Statistic AS VP Percent: 1 %
Brady Statistic AS VS Percent: 32 %
Brady Statistic RA Percent Paced: 66 %
Brady Statistic RV Percent Paced: 2.2 %
Date Time Interrogation Session: 20231108020012
Implantable Lead Connection Status: 753985
Implantable Lead Connection Status: 753985
Implantable Lead Implant Date: 20051022
Implantable Lead Implant Date: 20051022
Implantable Lead Location: 753859
Implantable Lead Location: 753860
Implantable Lead Model: 5076
Implantable Pulse Generator Implant Date: 20150601
Lead Channel Impedance Value: 380 Ohm
Lead Channel Impedance Value: 550 Ohm
Lead Channel Pacing Threshold Amplitude: 1 V
Lead Channel Pacing Threshold Amplitude: 1.375 V
Lead Channel Pacing Threshold Pulse Width: 0.4 ms
Lead Channel Pacing Threshold Pulse Width: 0.4 ms
Lead Channel Sensing Intrinsic Amplitude: 1.7 mV
Lead Channel Sensing Intrinsic Amplitude: 7.4 mV
Lead Channel Setting Pacing Amplitude: 2.5 V
Lead Channel Setting Pacing Amplitude: 5 V
Lead Channel Setting Pacing Pulse Width: 0.4 ms
Lead Channel Setting Sensing Sensitivity: 2 mV
Pulse Gen Model: 2240
Pulse Gen Serial Number: 7612285

## 2022-03-18 DIAGNOSIS — R634 Abnormal weight loss: Secondary | ICD-10-CM | POA: Diagnosis not present

## 2022-03-18 DIAGNOSIS — F32A Depression, unspecified: Secondary | ICD-10-CM | POA: Diagnosis not present

## 2022-03-23 DIAGNOSIS — I10 Essential (primary) hypertension: Secondary | ICD-10-CM | POA: Diagnosis not present

## 2022-03-23 DIAGNOSIS — I4891 Unspecified atrial fibrillation: Secondary | ICD-10-CM | POA: Diagnosis not present

## 2022-03-23 DIAGNOSIS — E46 Unspecified protein-calorie malnutrition: Secondary | ICD-10-CM | POA: Diagnosis not present

## 2022-03-23 DIAGNOSIS — G8929 Other chronic pain: Secondary | ICD-10-CM | POA: Diagnosis not present

## 2022-03-28 NOTE — Progress Notes (Signed)
Remote pacemaker transmission.   

## 2022-04-04 ENCOUNTER — Ambulatory Visit: Payer: PPO | Admitting: Adult Health

## 2022-04-20 DIAGNOSIS — R1312 Dysphagia, oropharyngeal phase: Secondary | ICD-10-CM | POA: Diagnosis not present

## 2022-04-20 DIAGNOSIS — R933 Abnormal findings on diagnostic imaging of other parts of digestive tract: Secondary | ICD-10-CM | POA: Diagnosis not present

## 2022-04-25 DIAGNOSIS — L603 Nail dystrophy: Secondary | ICD-10-CM | POA: Diagnosis not present

## 2022-04-25 DIAGNOSIS — B351 Tinea unguium: Secondary | ICD-10-CM | POA: Diagnosis not present

## 2022-04-25 DIAGNOSIS — I739 Peripheral vascular disease, unspecified: Secondary | ICD-10-CM | POA: Diagnosis not present

## 2022-05-01 IMAGING — DX DG SHOULDER 1V*L*
3 series · 3 of 3 positions shown · non-contrast
Comparison: None.

CLINICAL DATA: Left shoulder pain

EXAM:
LEFT SHOULDER

[shoulder ap]
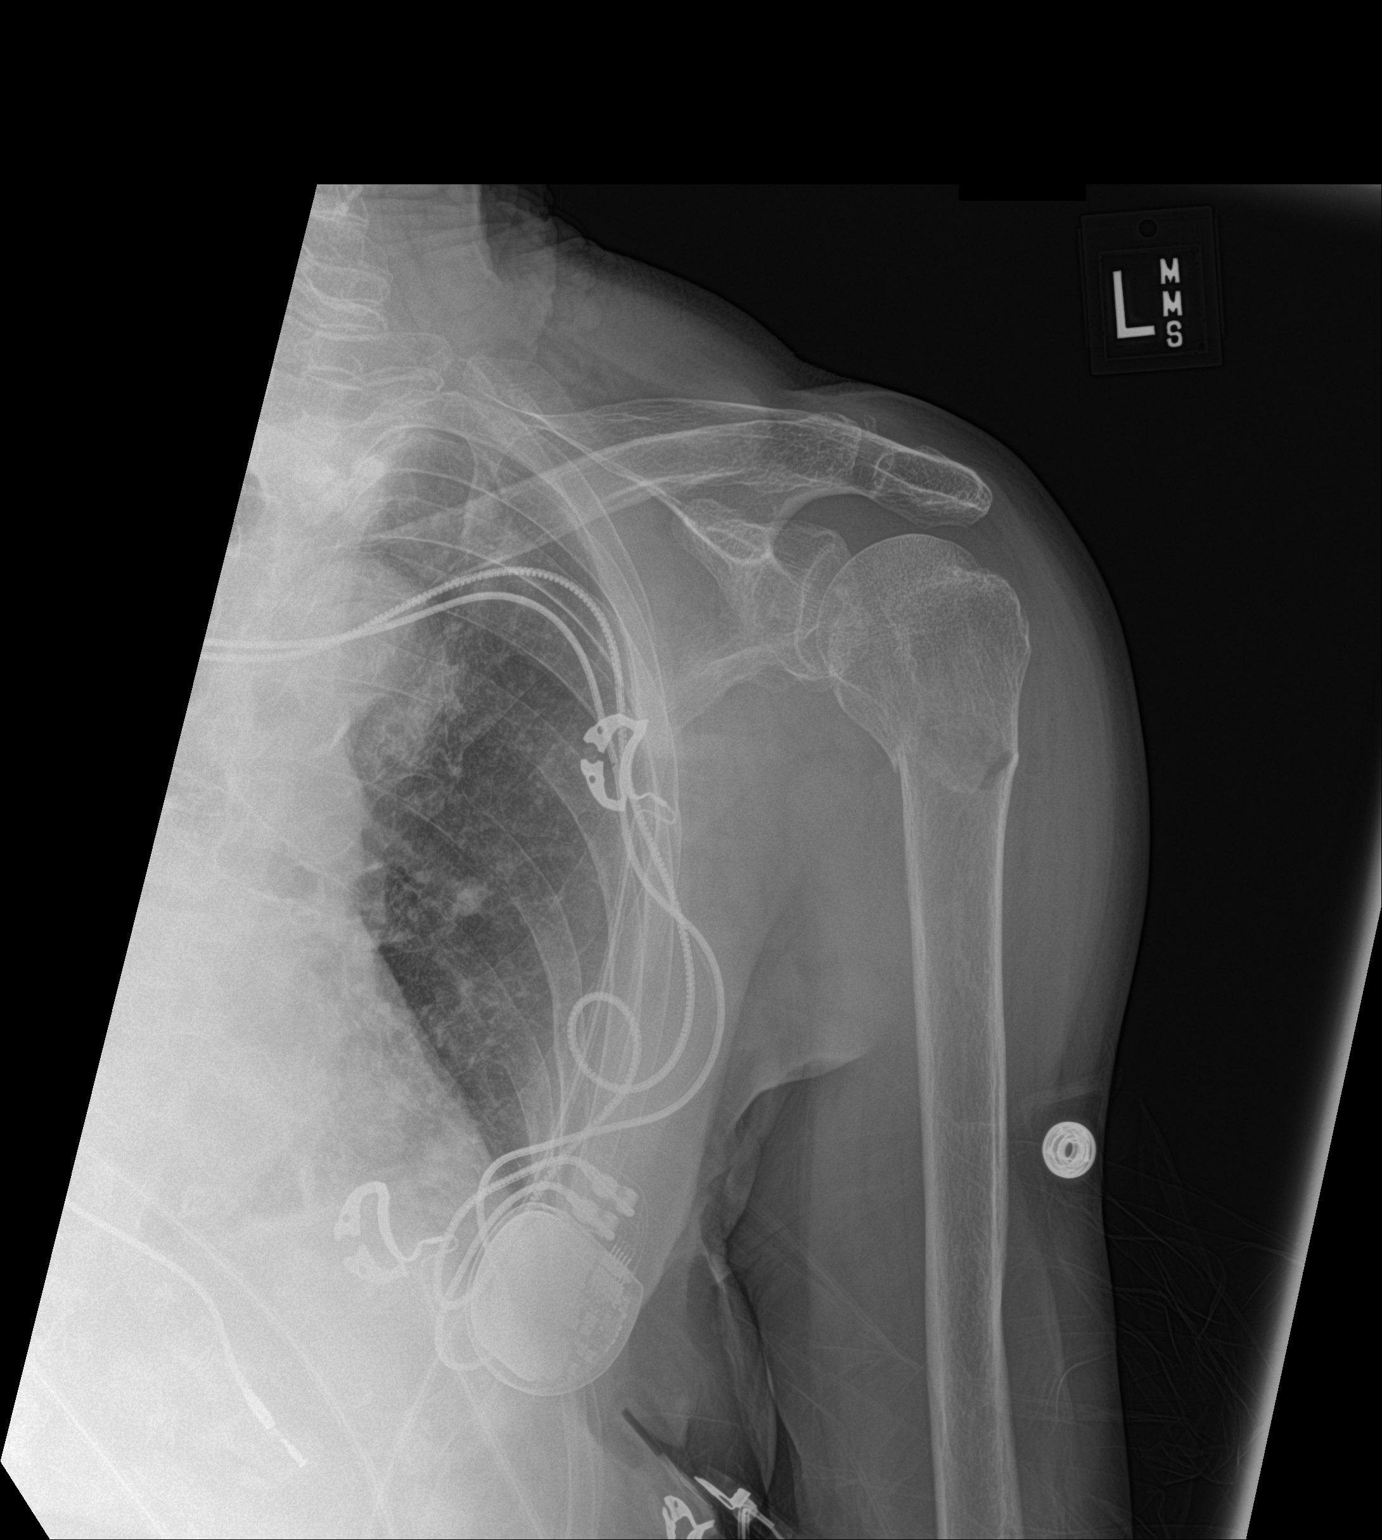

[shoulder obl (1 of 2)]
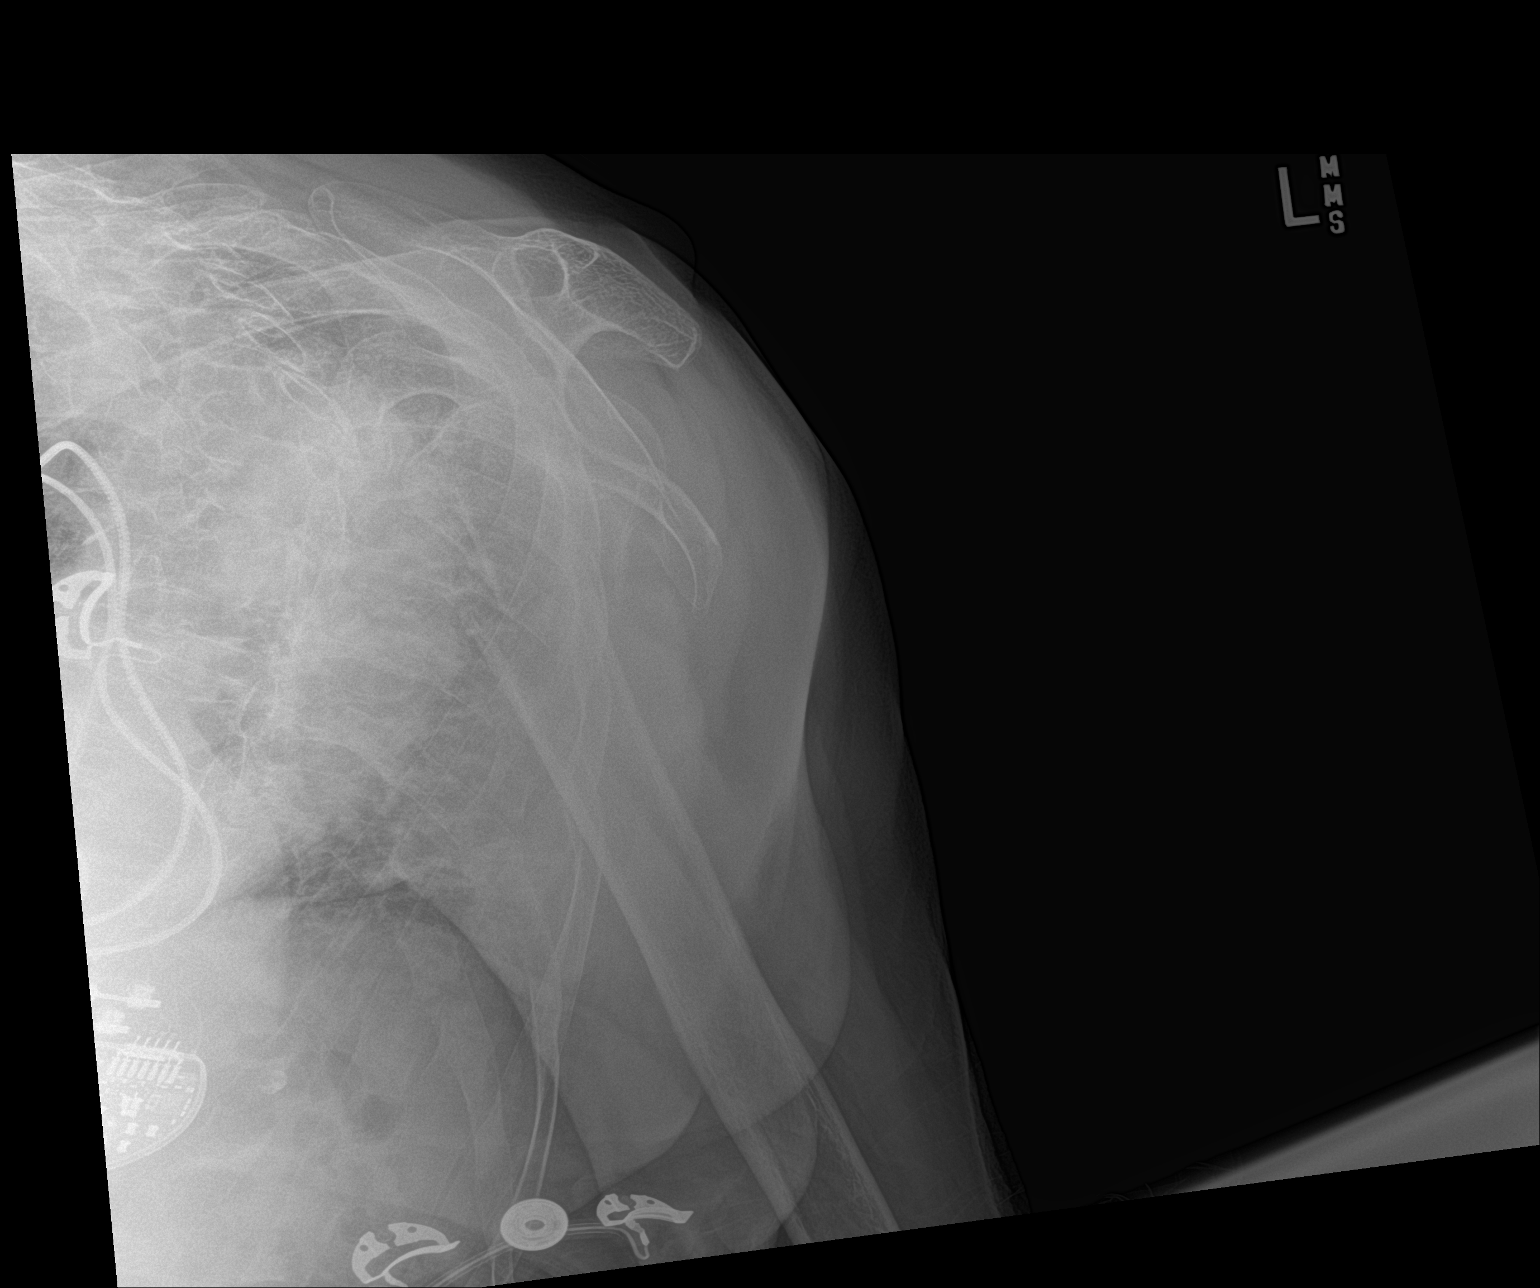

[shoulder obl (2 of 2)]
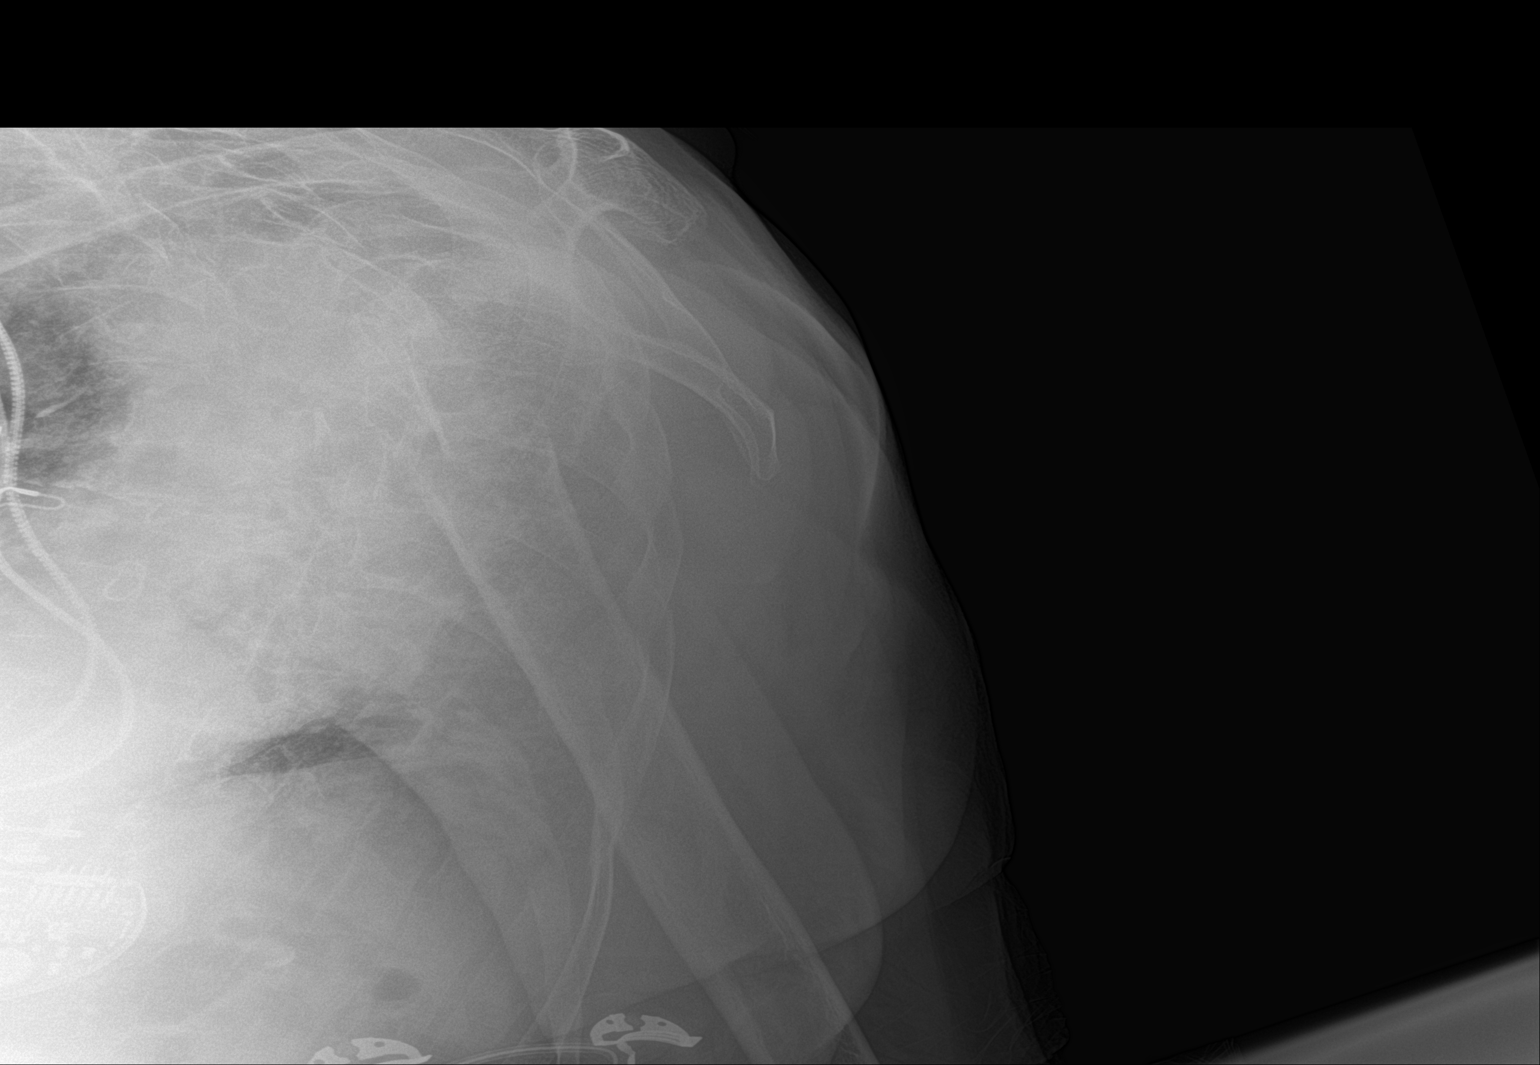

[3 of 3 positions shown; findings below may reference images not displayed]

FINDINGS: Three view radiograph of the left shoulder demonstrates an acute
transverse fracture of the surgical neck of the humerus with [DATE]
shaft with anterior displacement of the distal fracture fragment and
mild medial angulation of the distal fracture fragment. The left
humeral head is still seated within the glenoid fossa. Clavicle and
scapula appear intact.
IMPRESSION: Displaced, minimally angulated left proximal humeral surgical neck
fracture.

## 2022-05-16 DIAGNOSIS — I10 Essential (primary) hypertension: Secondary | ICD-10-CM | POA: Diagnosis not present

## 2022-05-16 DIAGNOSIS — K5900 Constipation, unspecified: Secondary | ICD-10-CM | POA: Diagnosis not present

## 2022-05-16 DIAGNOSIS — K219 Gastro-esophageal reflux disease without esophagitis: Secondary | ICD-10-CM | POA: Diagnosis not present

## 2022-05-16 DIAGNOSIS — F331 Major depressive disorder, recurrent, moderate: Secondary | ICD-10-CM | POA: Diagnosis not present

## 2022-05-16 DIAGNOSIS — H409 Unspecified glaucoma: Secondary | ICD-10-CM | POA: Diagnosis not present

## 2022-05-16 DIAGNOSIS — R52 Pain, unspecified: Secondary | ICD-10-CM | POA: Diagnosis not present

## 2022-05-24 DIAGNOSIS — U071 COVID-19: Secondary | ICD-10-CM

## 2022-06-15 ENCOUNTER — Ambulatory Visit (INDEPENDENT_AMBULATORY_CARE_PROVIDER_SITE_OTHER): Payer: PPO

## 2022-06-15 DIAGNOSIS — I495 Sick sinus syndrome: Secondary | ICD-10-CM

## 2022-06-15 LAB — CUP PACEART REMOTE DEVICE CHECK
Battery Remaining Longevity: 12 mo
Battery Remaining Percentage: 14 %
Battery Voltage: 2.86 V
Brady Statistic AP VP Percent: 2.7 %
Brady Statistic AP VS Percent: 65 %
Brady Statistic AS VP Percent: 1 %
Brady Statistic AS VS Percent: 32 %
Brady Statistic RA Percent Paced: 66 %
Brady Statistic RV Percent Paced: 2.9 %
Date Time Interrogation Session: 20240207020014
Implantable Lead Connection Status: 753985
Implantable Lead Connection Status: 753985
Implantable Lead Implant Date: 20051022
Implantable Lead Implant Date: 20051022
Implantable Lead Location: 753859
Implantable Lead Location: 753860
Implantable Lead Model: 5076
Implantable Pulse Generator Implant Date: 20150601
Lead Channel Impedance Value: 400 Ohm
Lead Channel Impedance Value: 550 Ohm
Lead Channel Pacing Threshold Amplitude: 1 V
Lead Channel Pacing Threshold Amplitude: 1.375 V
Lead Channel Pacing Threshold Pulse Width: 0.4 ms
Lead Channel Pacing Threshold Pulse Width: 0.4 ms
Lead Channel Sensing Intrinsic Amplitude: 0.9 mV
Lead Channel Sensing Intrinsic Amplitude: 7.2 mV
Lead Channel Setting Pacing Amplitude: 2.5 V
Lead Channel Setting Pacing Amplitude: 5 V
Lead Channel Setting Pacing Pulse Width: 0.4 ms
Lead Channel Setting Sensing Sensitivity: 2 mV
Pulse Gen Model: 2240
Pulse Gen Serial Number: 7612285

## 2022-06-20 DIAGNOSIS — B372 Candidiasis of skin and nail: Secondary | ICD-10-CM | POA: Diagnosis not present

## 2022-06-24 DIAGNOSIS — D649 Anemia, unspecified: Secondary | ICD-10-CM | POA: Diagnosis not present

## 2022-06-27 DIAGNOSIS — Z9181 History of falling: Secondary | ICD-10-CM | POA: Diagnosis not present

## 2022-06-27 DIAGNOSIS — R2689 Other abnormalities of gait and mobility: Secondary | ICD-10-CM | POA: Diagnosis not present

## 2022-06-27 DIAGNOSIS — M6281 Muscle weakness (generalized): Secondary | ICD-10-CM | POA: Diagnosis not present

## 2022-06-27 DIAGNOSIS — B372 Candidiasis of skin and nail: Secondary | ICD-10-CM | POA: Diagnosis not present

## 2022-06-27 DIAGNOSIS — W19XXXA Unspecified fall, initial encounter: Secondary | ICD-10-CM | POA: Diagnosis not present

## 2022-07-01 DIAGNOSIS — Z9181 History of falling: Secondary | ICD-10-CM

## 2022-07-01 DIAGNOSIS — R2689 Other abnormalities of gait and mobility: Secondary | ICD-10-CM

## 2022-07-01 DIAGNOSIS — W19XXXA Unspecified fall, initial encounter: Secondary | ICD-10-CM

## 2022-07-01 DIAGNOSIS — M6281 Muscle weakness (generalized): Secondary | ICD-10-CM

## 2022-07-01 DIAGNOSIS — S51811A Laceration without foreign body of right forearm, initial encounter: Secondary | ICD-10-CM

## 2022-07-04 DIAGNOSIS — H401134 Primary open-angle glaucoma, bilateral, indeterminate stage: Secondary | ICD-10-CM | POA: Diagnosis not present

## 2022-07-04 DIAGNOSIS — H25813 Combined forms of age-related cataract, bilateral: Secondary | ICD-10-CM | POA: Diagnosis not present

## 2022-07-12 NOTE — Progress Notes (Signed)
Remote pacemaker transmission.   

## 2022-07-20 DIAGNOSIS — L84 Corns and callosities: Secondary | ICD-10-CM | POA: Diagnosis not present

## 2022-07-20 DIAGNOSIS — B351 Tinea unguium: Secondary | ICD-10-CM | POA: Diagnosis not present

## 2022-07-20 DIAGNOSIS — I739 Peripheral vascular disease, unspecified: Secondary | ICD-10-CM | POA: Diagnosis not present

## 2022-07-20 DIAGNOSIS — L603 Nail dystrophy: Secondary | ICD-10-CM | POA: Diagnosis not present

## 2022-07-30 DIAGNOSIS — M19011 Primary osteoarthritis, right shoulder: Secondary | ICD-10-CM | POA: Diagnosis not present

## 2022-08-29 DIAGNOSIS — W19XXXA Unspecified fall, initial encounter: Secondary | ICD-10-CM | POA: Diagnosis not present

## 2022-08-29 DIAGNOSIS — Z9181 History of falling: Secondary | ICD-10-CM | POA: Diagnosis not present

## 2022-08-29 DIAGNOSIS — R2689 Other abnormalities of gait and mobility: Secondary | ICD-10-CM | POA: Diagnosis not present

## 2022-08-29 DIAGNOSIS — M6281 Muscle weakness (generalized): Secondary | ICD-10-CM | POA: Diagnosis not present

## 2022-09-02 DIAGNOSIS — G8929 Other chronic pain: Secondary | ICD-10-CM

## 2022-09-02 DIAGNOSIS — M25551 Pain in right hip: Secondary | ICD-10-CM

## 2022-09-02 DIAGNOSIS — M6281 Muscle weakness (generalized): Secondary | ICD-10-CM

## 2022-09-02 DIAGNOSIS — Z9181 History of falling: Secondary | ICD-10-CM

## 2022-09-02 DIAGNOSIS — R2689 Other abnormalities of gait and mobility: Secondary | ICD-10-CM

## 2022-09-02 DIAGNOSIS — R41841 Cognitive communication deficit: Secondary | ICD-10-CM

## 2022-09-02 DIAGNOSIS — R52 Pain, unspecified: Secondary | ICD-10-CM

## 2022-09-14 ENCOUNTER — Ambulatory Visit (INDEPENDENT_AMBULATORY_CARE_PROVIDER_SITE_OTHER)

## 2022-09-14 DIAGNOSIS — I10 Essential (primary) hypertension: Secondary | ICD-10-CM

## 2022-09-14 DIAGNOSIS — M6281 Muscle weakness (generalized): Secondary | ICD-10-CM

## 2022-09-14 DIAGNOSIS — I495 Sick sinus syndrome: Secondary | ICD-10-CM

## 2022-09-14 DIAGNOSIS — Z710 Person encountering health services to consult on behalf of another person: Secondary | ICD-10-CM

## 2022-09-14 LAB — CUP PACEART REMOTE DEVICE CHECK
Battery Remaining Longevity: 10 mo
Battery Remaining Percentage: 11 %
Battery Voltage: 2.84 V
Brady Statistic AP VP Percent: 3.1 %
Brady Statistic AP VS Percent: 64 %
Brady Statistic AS VP Percent: 1 %
Brady Statistic AS VS Percent: 33 %
Brady Statistic RA Percent Paced: 66 %
Brady Statistic RV Percent Paced: 3.4 %
Date Time Interrogation Session: 20240508020016
Implantable Lead Connection Status: 753985
Implantable Lead Connection Status: 753985
Implantable Lead Implant Date: 20051022
Implantable Lead Implant Date: 20051022
Implantable Lead Location: 753859
Implantable Lead Location: 753860
Implantable Lead Model: 5076
Implantable Pulse Generator Implant Date: 20150601
Lead Channel Impedance Value: 400 Ohm
Lead Channel Impedance Value: 530 Ohm
Lead Channel Pacing Threshold Amplitude: 1 V
Lead Channel Pacing Threshold Amplitude: 1.375 V
Lead Channel Pacing Threshold Pulse Width: 0.4 ms
Lead Channel Pacing Threshold Pulse Width: 0.4 ms
Lead Channel Sensing Intrinsic Amplitude: 1.6 mV
Lead Channel Sensing Intrinsic Amplitude: 5.7 mV
Lead Channel Setting Pacing Amplitude: 2.5 V
Lead Channel Setting Pacing Amplitude: 5 V
Lead Channel Setting Pacing Pulse Width: 0.4 ms
Lead Channel Setting Sensing Sensitivity: 2 mV
Pulse Gen Model: 2240
Pulse Gen Serial Number: 7612285

## 2022-09-22 DIAGNOSIS — M6281 Muscle weakness (generalized): Secondary | ICD-10-CM | POA: Diagnosis not present

## 2022-09-22 DIAGNOSIS — Z9181 History of falling: Secondary | ICD-10-CM

## 2022-09-22 DIAGNOSIS — W19XXXA Unspecified fall, initial encounter: Secondary | ICD-10-CM

## 2022-09-22 DIAGNOSIS — R2689 Other abnormalities of gait and mobility: Secondary | ICD-10-CM

## 2022-10-05 NOTE — Progress Notes (Signed)
Remote pacemaker transmission.   

## 2022-10-06 DIAGNOSIS — R682 Dry mouth, unspecified: Secondary | ICD-10-CM

## 2022-10-06 DIAGNOSIS — Z7689 Persons encountering health services in other specified circumstances: Secondary | ICD-10-CM

## 2022-10-06 DIAGNOSIS — R070 Pain in throat: Secondary | ICD-10-CM

## 2022-10-06 DIAGNOSIS — F0394 Unspecified dementia, unspecified severity, with anxiety: Secondary | ICD-10-CM

## 2022-10-06 DIAGNOSIS — R051 Acute cough: Secondary | ICD-10-CM

## 2022-10-06 DIAGNOSIS — R41841 Cognitive communication deficit: Secondary | ICD-10-CM

## 2022-10-11 DIAGNOSIS — R2689 Other abnormalities of gait and mobility: Secondary | ICD-10-CM | POA: Diagnosis not present

## 2022-10-11 DIAGNOSIS — Z9181 History of falling: Secondary | ICD-10-CM | POA: Diagnosis not present

## 2022-10-11 DIAGNOSIS — W19XXXA Unspecified fall, initial encounter: Secondary | ICD-10-CM | POA: Diagnosis not present

## 2022-11-04 DIAGNOSIS — I4891 Unspecified atrial fibrillation: Secondary | ICD-10-CM

## 2022-11-04 DIAGNOSIS — I10 Essential (primary) hypertension: Secondary | ICD-10-CM

## 2022-11-04 DIAGNOSIS — R52 Pain, unspecified: Secondary | ICD-10-CM

## 2022-11-04 DIAGNOSIS — R918 Other nonspecific abnormal finding of lung field: Secondary | ICD-10-CM

## 2022-11-04 DIAGNOSIS — R400 Somnolence: Secondary | ICD-10-CM

## 2022-11-04 DIAGNOSIS — K219 Gastro-esophageal reflux disease without esophagitis: Secondary | ICD-10-CM

## 2022-11-07 DEATH — deceased

## 2022-12-27 ENCOUNTER — Ambulatory Visit: Payer: PPO | Admitting: Neurology
